# Patient Record
Sex: Female | Born: 1970 | Race: White | Hispanic: No | Smoking: Current every day smoker
Health system: Southern US, Community
[De-identification: ages and names within clinical notes are randomized; demographics above are authoritative.]

## PROBLEM LIST (undated history)

## (undated) DIAGNOSIS — D649 Anemia, unspecified: Secondary | ICD-10-CM

## (undated) DIAGNOSIS — D259 Leiomyoma of uterus, unspecified: Secondary | ICD-10-CM

---

## 2002-02-22 HISTORY — PX: DILATION AND CURETTAGE OF UTERUS: SHX78

## 2002-03-07 ENCOUNTER — Other Ambulatory Visit: Admission: RE | Admit: 2002-03-07 | Discharge: 2002-03-07 | Payer: Self-pay | Admitting: Obstetrics and Gynecology

## 2002-04-06 ENCOUNTER — Encounter (INDEPENDENT_AMBULATORY_CARE_PROVIDER_SITE_OTHER): Payer: Self-pay

## 2002-04-06 ENCOUNTER — Ambulatory Visit (HOSPITAL_COMMUNITY): Admission: RE | Admit: 2002-04-06 | Discharge: 2002-04-06 | Payer: Self-pay | Admitting: Obstetrics and Gynecology

## 2002-12-20 ENCOUNTER — Emergency Department (HOSPITAL_COMMUNITY): Admission: EM | Admit: 2002-12-20 | Discharge: 2002-12-20 | Payer: Self-pay | Admitting: Emergency Medicine

## 2009-12-12 ENCOUNTER — Emergency Department (HOSPITAL_COMMUNITY): Admission: EM | Admit: 2009-12-12 | Discharge: 2009-12-12 | Payer: Self-pay | Admitting: Emergency Medicine

## 2010-05-06 LAB — POCT URINALYSIS DIPSTICK
Glucose, UA: NEGATIVE mg/dL
Hgb urine dipstick: NEGATIVE
Nitrite: NEGATIVE
Protein, ur: NEGATIVE mg/dL
Specific Gravity, Urine: >=1.03 (ref 1.005–1.030)
Urobilinogen, UA: 0.2 mg/dL (ref 0.0–1.0)
pH: 5 (ref 5.0–8.0)

## 2010-05-06 LAB — POCT PREGNANCY, URINE: Preg Test, Ur: NEGATIVE

## 2010-07-02 ENCOUNTER — Emergency Department (HOSPITAL_COMMUNITY)
Admission: EM | Admit: 2010-07-02 | Discharge: 2010-07-02 | Disposition: A | Discharge status: Home / Self Care | Payer: Self-pay | Attending: Emergency Medicine | Admitting: Emergency Medicine

## 2010-07-02 ENCOUNTER — Emergency Department (HOSPITAL_COMMUNITY): Payer: Self-pay

## 2010-07-02 DIAGNOSIS — B356 Tinea cruris: Secondary | ICD-10-CM | POA: Insufficient documentation

## 2010-07-02 DIAGNOSIS — R109 Unspecified abdominal pain: Secondary | ICD-10-CM | POA: Insufficient documentation

## 2010-07-02 DIAGNOSIS — R300 Dysuria: Secondary | ICD-10-CM | POA: Insufficient documentation

## 2010-07-02 LAB — URINALYSIS, ROUTINE W REFLEX MICROSCOPIC
Bilirubin Urine: NEGATIVE
Glucose, UA: NEGATIVE mg/dL
Ketones, ur: NEGATIVE mg/dL
Nitrite: NEGATIVE
Protein, ur: NEGATIVE mg/dL
Specific Gravity, Urine: 1.01 (ref 1.005–1.030)
Urobilinogen, UA: 0.2 mg/dL (ref 0.0–1.0)
pH: 6 (ref 5.0–8.0)

## 2010-07-02 LAB — URINE MICROSCOPIC-ADD ON

## 2010-07-02 LAB — WET PREP, GENITAL
Trich, Wet Prep: NONE SEEN
Yeast Wet Prep HPF POC: NONE SEEN

## 2010-07-02 LAB — PREGNANCY, URINE: Preg Test, Ur: NEGATIVE

## 2010-07-03 LAB — GC/CHLAMYDIA PROBE AMP, GENITAL
Chlamydia, DNA Probe: NEGATIVE
GC Probe Amp, Genital: NEGATIVE

## 2010-07-10 NOTE — Op Note (Signed)
[name], [name]                              ACCOUNT NO.:  [id number]   MEDICAL RECORD NO.:  [id number]                   PATIENT TYPE:  AMB   LOCATION:  SDC                                  FACILITY:  [facility]   PHYSICIAN:  Miguel Aschoff, M.D.                    DATE OF BIRTH:  [birth year]   DATE OF PROCEDURE:  04/06/2002  DATE OF DISCHARGE:                                 OPERATIVE REPORT   PREOPERATIVE DIAGNOSIS:  Missed abortion.   POSTOPERATIVE DIAGNOSIS:  Missed abortion.   PROCEDURE:  Suction curettage.   SURGEON:  Miguel Aschoff, M.D.   ANESTHESIA:  IV sedation with paracervical block.   COMPLICATIONS:  None.   JUSTIFICATION:  The patient is a 40 year old white female gravida 2 para 1-0-  0-1 with last menstrual period of January 31, 2002.  She is now  approximately nine weeks by dates.  Serial ultrasound examinations have  revealed a significant lag in the growth of the gestational sac measuring  only six weeks with a fetal pole noted with no fetal heart activity.  The  dating is certain, and in view of these abnormal serial scans and no fetal  heart activity, she presents now to undergo suction curettage for missed  abortion.   DESCRIPTION OF PROCEDURE:  The patient was taken to the operating room,  placed in the supine position.  IV sedation was administered without  difficulty.  She was then placed in the dorsal lithotomy position, prepped  and draped in the usual sterile fashion.  Examination revealed the uterus to  be anterior, approximately seven to eight weeks in size.  No adnexal masses  are noted.  A speculum was placed in the vaginal vault.  The anterior  cervical lip was then injected with 6 mL of 1% Xylocaine; an additional 6 mL  were placed at the 4 o'clock and 8 o'clock positions.  After this was done,  the cervix was grasped with the tenaculum.  The endocervical canal was  dilated using 0 Pratt dilators and then a #8 vacuum trap was used to  evacuate the  contents of the uterus without difficulty.  A moderate amount  of POCs were obtained and sent for histologic study.  Sharp curettage  following suction curettage did not yield any significant additional tissue  and a final pass was made with the suction curette, and the procedure was  completed.  Hemostasis was readily achieved.  All instruments were removed.  Lap counts were taken and found to be correct.  At this point, the patient  was reversed from the anesthetic, taken to the recovery room in satisfactory  condition.  The patient is noted to be O positive blood type.   DISPOSITION:  1. The patient to be discharged home.  2. Medications for home include:     a. Doxycycline 100 mg twice a day  x3 days.     b. Darvocet-N 100 one q.4h. as needed for pain.  3.     She was instructed to place nothing in the vagina for two weeks and to call      for any problems such as fever, pain, or heavy bleeding.  4. The patient will be seen back in four weeks for follow-up examination.                                               Miguel Aschoff, M.D.    AR/MEDQ  D:  04/06/2002  T:  04/06/2002  Job:  [id number]

## 2014-05-02 ENCOUNTER — Encounter (HOSPITAL_COMMUNITY): Payer: Self-pay | Admitting: Emergency Medicine

## 2014-05-02 ENCOUNTER — Inpatient Hospital Stay (HOSPITAL_COMMUNITY)
Admission: EM | Admit: 2014-05-02 | Discharge: 2014-05-03 | DRG: 811 | Disposition: A | Discharge status: Home / Self Care | Payer: Medicaid Other | Attending: Internal Medicine | Admitting: Internal Medicine

## 2014-05-02 ENCOUNTER — Emergency Department (HOSPITAL_COMMUNITY): Payer: Medicaid Other

## 2014-05-02 DIAGNOSIS — R011 Cardiac murmur, unspecified: Secondary | ICD-10-CM | POA: Diagnosis present

## 2014-05-02 DIAGNOSIS — E876 Hypokalemia: Secondary | ICD-10-CM | POA: Diagnosis present

## 2014-05-02 DIAGNOSIS — Z6822 Body mass index (BMI) 22.0-22.9, adult: Secondary | ICD-10-CM

## 2014-05-02 DIAGNOSIS — Z720 Tobacco use: Secondary | ICD-10-CM

## 2014-05-02 DIAGNOSIS — N8320 Unspecified ovarian cysts: Secondary | ICD-10-CM

## 2014-05-02 DIAGNOSIS — E871 Hypo-osmolality and hyponatremia: Secondary | ICD-10-CM | POA: Diagnosis present

## 2014-05-02 DIAGNOSIS — D649 Anemia, unspecified: Principal | ICD-10-CM

## 2014-05-02 DIAGNOSIS — R109 Unspecified abdominal pain: Secondary | ICD-10-CM | POA: Insufficient documentation

## 2014-05-02 DIAGNOSIS — N926 Irregular menstruation, unspecified: Secondary | ICD-10-CM | POA: Diagnosis present

## 2014-05-02 DIAGNOSIS — D259 Leiomyoma of uterus, unspecified: Secondary | ICD-10-CM

## 2014-05-02 DIAGNOSIS — E43 Unspecified severe protein-calorie malnutrition: Secondary | ICD-10-CM | POA: Diagnosis present

## 2014-05-02 DIAGNOSIS — F17210 Nicotine dependence, cigarettes, uncomplicated: Secondary | ICD-10-CM | POA: Diagnosis present

## 2014-05-02 HISTORY — DX: Leiomyoma of uterus, unspecified: D25.9

## 2014-05-02 HISTORY — DX: Anemia, unspecified: D64.9

## 2014-05-02 LAB — I-STAT TROPONIN, ED: Troponin i, poc: 0.01 ng/mL (ref 0.00–0.08)

## 2014-05-02 LAB — CBC WITH DIFFERENTIAL/PLATELET
Basophils Absolute: 0.1 10*3/uL (ref 0.0–0.1)
Basophils Relative: 2 % — ABNORMAL HIGH (ref 0–1)
Eosinophils Absolute: 0 10*3/uL (ref 0.0–0.7)
Eosinophils Relative: 1 % (ref 0–5)
HCT: 16.8 % — ABNORMAL LOW (ref 36.0–46.0)
Hemoglobin: 4 g/dL — CL (ref 12.0–15.0)
Lymphocytes Relative: 28 % (ref 12–46)
Lymphs Abs: 1.3 10*3/uL (ref 0.7–4.0)
MCH: 13.9 pg — ABNORMAL LOW (ref 26.0–34.0)
MCHC: 23.8 g/dL — ABNORMAL LOW (ref 30.0–36.0)
MCV: 58.3 fL — ABNORMAL LOW (ref 78.0–100.0)
Monocytes Absolute: 0.3 10*3/uL (ref 0.1–1.0)
Monocytes Relative: 7 % (ref 3–12)
Neutro Abs: 2.8 10*3/uL (ref 1.7–7.7)
Neutrophils Relative %: 62 % (ref 43–77)
Platelets: 245 10*3/uL (ref 150–400)
RBC: 2.88 MIL/uL — ABNORMAL LOW (ref 3.87–5.11)
RDW: 19.7 % — ABNORMAL HIGH (ref 11.5–15.5)
WBC: 4.5 10*3/uL (ref 4.0–10.5)

## 2014-05-02 LAB — URINALYSIS, ROUTINE W REFLEX MICROSCOPIC
Bilirubin Urine: NEGATIVE
Glucose, UA: NEGATIVE mg/dL
Hgb urine dipstick: NEGATIVE
Ketones, ur: NEGATIVE mg/dL
Leukocytes, UA: NEGATIVE
Nitrite: NEGATIVE
Protein, ur: NEGATIVE mg/dL
Specific Gravity, Urine: 1.015 (ref 1.005–1.030)
Urobilinogen, UA: 0.2 mg/dL (ref 0.0–1.0)
pH: 5.5 (ref 5.0–8.0)

## 2014-05-02 LAB — COMPREHENSIVE METABOLIC PANEL
ALT: 13 U/L (ref 0–35)
AST: 13 U/L (ref 0–37)
Albumin: 4 g/dL (ref 3.5–5.2)
Alkaline Phosphatase: 48 U/L (ref 39–117)
Anion gap: 3 — ABNORMAL LOW (ref 5–15)
BUN: 10 mg/dL (ref 6–23)
CO2: 26 mmol/L (ref 19–32)
Calcium: 8.6 mg/dL (ref 8.4–10.5)
Chloride: 105 mmol/L (ref 96–112)
Creatinine, Ser: 0.46 mg/dL — ABNORMAL LOW (ref 0.50–1.10)
GFR calc Af Amer: >90 mL/min (ref >90)
GFR calc non Af Amer: >90 mL/min (ref >90)
Glucose, Bld: 89 mg/dL (ref 70–99)
Potassium: 3.8 mmol/L (ref 3.5–5.1)
Sodium: 134 mmol/L — ABNORMAL LOW (ref 135–145)
Total Bilirubin: 0.5 mg/dL (ref 0.3–1.2)
Total Protein: 7.1 g/dL (ref 6.0–8.3)

## 2014-05-02 LAB — IRON AND TIBC
Iron: <10 ug/dL — ABNORMAL LOW (ref 42–145)
UIBC: 477 ug/dL — ABNORMAL HIGH (ref 125–400)

## 2014-05-02 LAB — ABO/RH: ABO/RH(D): A POS

## 2014-05-02 LAB — VITAMIN B12: Vitamin B-12: 309 pg/mL (ref 211–911)

## 2014-05-02 LAB — POC OCCULT BLOOD, ED: Fecal Occult Bld: NEGATIVE

## 2014-05-02 LAB — PREPARE RBC (CROSSMATCH)

## 2014-05-02 LAB — RETICULOCYTES
RBC.: 2.71 MIL/uL — ABNORMAL LOW (ref 3.87–5.11)
Retic Count, Absolute: 32.5 10*3/uL (ref 19.0–186.0)
Retic Ct Pct: 1.2 % (ref 0.4–3.1)

## 2014-05-02 LAB — FOLATE: Folate: 14.9 ng/mL

## 2014-05-02 LAB — FERRITIN: Ferritin: 2 ng/mL — ABNORMAL LOW (ref 10–291)

## 2014-05-02 LAB — LIPASE, BLOOD: Lipase: 20 U/L (ref 11–59)

## 2014-05-02 LAB — POC URINE PREG, ED: PREG TEST UR: NEGATIVE

## 2014-05-02 MED ORDER — DIPHENHYDRAMINE HCL 25 MG PO CAPS: 25.0000 mg | ORAL_CAPSULE | Freq: Once | ORAL | Status: DC

## 2014-05-02 MED ORDER — IOHEXOL 300 MG/ML  SOLN
100.0000 mL | Freq: Once | INTRAMUSCULAR | Status: AC | PRN
  Administered 2014-05-02: 100 mL via INTRAVENOUS

## 2014-05-02 MED ORDER — HYDROMORPHONE HCL 1 MG/ML IJ SOLN
1.0000 mg | Freq: Once | INTRAMUSCULAR | Status: AC
  Administered 2014-05-02: 1 mg via INTRAVENOUS
  Filled 2014-05-02: qty 1

## 2014-05-02 MED ORDER — SODIUM CHLORIDE 0.9 % IV SOLN
Freq: Once | INTRAVENOUS | Status: AC
  Administered 2014-05-02: 250 mL via INTRAVENOUS

## 2014-05-02 MED ORDER — NICOTINE 21 MG/24HR TD PT24
21.0000 mg | MEDICATED_PATCH | Freq: Every day | TRANSDERMAL | Status: DC
  Administered 2014-05-03: 21 mg via TRANSDERMAL
  Filled 2014-05-02 (×2): qty 1

## 2014-05-02 MED ORDER — ACETAMINOPHEN 650 MG RE SUPP: 650.0000 mg | Freq: Four times a day (QID) | RECTAL | Status: DC | PRN

## 2014-05-02 MED ORDER — IOHEXOL 300 MG/ML  SOLN
50.0000 mL | Freq: Once | INTRAMUSCULAR | Status: AC | PRN
  Administered 2014-05-02: 50 mL via ORAL

## 2014-05-02 MED ORDER — ONDANSETRON HCL 4 MG/2ML IJ SOLN
4.0000 mg | Freq: Four times a day (QID) | INTRAMUSCULAR | Status: DC | PRN
Start: 2014-05-02 — End: 2014-05-03
  Administered 2014-05-02: 4 mg via INTRAVENOUS
  Filled 2014-05-02: qty 2

## 2014-05-02 MED ORDER — ACETAMINOPHEN 325 MG PO TABS: 650.0000 mg | ORAL_TABLET | Freq: Once | ORAL | Status: DC

## 2014-05-02 MED ORDER — SODIUM CHLORIDE 0.9 % IV SOLN
INTRAVENOUS | Status: DC
  Administered 2014-05-02 – 2014-05-03 (×2): 1000 mL via INTRAVENOUS

## 2014-05-02 MED ORDER — ONDANSETRON HCL 4 MG/2ML IJ SOLN
4.0000 mg | Freq: Once | INTRAMUSCULAR | Status: AC
  Administered 2014-05-02: 4 mg via INTRAVENOUS
  Filled 2014-05-02: qty 2

## 2014-05-02 MED ORDER — ACETAMINOPHEN 325 MG PO TABS: 650.0000 mg | ORAL_TABLET | Freq: Four times a day (QID) | ORAL | Status: DC | PRN

## 2014-05-02 MED ORDER — OXYCODONE-ACETAMINOPHEN 5-325 MG PO TABS
2.0000 | ORAL_TABLET | Freq: Once | ORAL | Status: AC
  Administered 2014-05-03: 1 via ORAL
  Filled 2014-05-02: qty 2

## 2014-05-02 MED ORDER — SODIUM CHLORIDE 0.9 % IV SOLN
Freq: Once | INTRAVENOUS | Status: AC
  Administered 2014-05-02: 19:00:00 via INTRAVENOUS

## 2014-05-02 MED ORDER — FUROSEMIDE 10 MG/ML IJ SOLN
20.0000 mg | Freq: Once | INTRAMUSCULAR | Status: AC
  Administered 2014-05-03: 20 mg via INTRAVENOUS
  Filled 2014-05-02 (×2): qty 2

## 2014-05-02 NOTE — ED Provider Notes (Signed)
CSN: [id number]     Arrival date & time 05/02/14  1258 History   First MD Initiated Contact with Patient 05/02/14 1501     Chief Complaint  Patient presents with  . Abdominal Pain     (Consider location/radiation/quality/duration/timing/severity/associated sxs/prior Treatment) Patient is a 44 y.o. female presenting with abdominal pain. The history is provided by the patient. No language interpreter was used.  Abdominal Pain Pain location:  LLQ and RLQ Pain quality: heavy   Pain radiates to:  L flank Pain severity:  Moderate Onset quality:  Gradual Timing:  Intermittent Progression:  Worsening Chronicity:  New Associated symptoms: fatigue, nausea and shortness of breath   Associated symptoms: no diarrhea, no vaginal bleeding, no vaginal discharge and no vomiting   Nausea:    Severity:  Moderate   Onset quality:  Gradual   Timing:  Intermittent (with eating)   Past Medical History  Diagnosis Date  . Fibroid, uterine   . Anemia    History reviewed. No pertinent past surgical history. History reviewed. No pertinent family history. History  Substance Use Topics  . Smoking status: Current Every Day Smoker  . Smokeless tobacco: Not on file  . Alcohol Use: No   OB History    No data available     Review of Systems  Constitutional: Positive for fatigue.  Respiratory: Positive for shortness of breath.   Gastrointestinal: Positive for nausea and abdominal pain. Negative for vomiting, diarrhea, blood in stool, anal bleeding and rectal pain.  Genitourinary: Positive for flank pain. Negative for vaginal bleeding and vaginal discharge.  All other systems reviewed and are negative.     Allergies  Review of patient's allergies indicates no known allergies.  Home Medications   Prior to Admission medications   Medication Sig Start Date End Date Taking? Authorizing Provider  Aspirin-Salicylamide-Caffeine (BC HEADACHE POWDER PO) Take 1 packet by mouth as needed (headache).    Yes Historical Provider, MD  ibuprofen (ADVIL,MOTRIN) 200 MG tablet Take 800 mg by mouth every 6 (six) hours as needed for headache or moderate pain.   Yes Historical Provider, MD   BP 134/56 mmHg  Pulse 85  Temp(Src) 98.9 F (37.2 C) (Oral)  Resp 14  SpO2 94% Physical Exam  Constitutional: She is oriented to person, place, and time. She appears well-developed and well-nourished.  HENT:  Head: Normocephalic and atraumatic.  Eyes: Pupils are equal, round, and reactive to light.  Neck: Neck supple.  Cardiovascular: Normal rate and regular rhythm.   Pulmonary/Chest: Effort normal and breath sounds normal.  Abdominal: Soft. Bowel sounds are normal. She exhibits mass. There is tenderness.  Musculoskeletal: She exhibits no edema or tenderness.  Lymphadenopathy:    She has no cervical adenopathy.  Neurological: She is alert and oriented to person, place, and time.  Skin: Skin is warm and dry.  Psychiatric: She has a normal mood and affect.  Nursing note and vitals reviewed.   ED Course  Procedures (including critical care time) Labs Review Labs Reviewed  CBC WITH DIFFERENTIAL/PLATELET - Abnormal; Notable for the following:    RBC 2.88 (*)    Hemoglobin 4.0 (*)    HCT 16.8 (*)    MCV 58.3 (*)    MCH 13.9 (*)    MCHC 23.8 (*)    RDW 19.7 (*)    All other components within normal limits  COMPREHENSIVE METABOLIC PANEL - Abnormal; Notable for the following:    Sodium 134 (*)    Creatinine, Ser 0.46 (*)  Anion gap 3 (*)    All other components within normal limits  LIPASE, BLOOD  URINALYSIS, ROUTINE W REFLEX MICROSCOPIC  VITAMIN B12  FOLATE  IRON AND TIBC  FERRITIN  RETICULOCYTES  POC OCCULT BLOOD, ED  TYPE AND SCREEN    Imaging Review No results found.   EKG Interpretation None     44 year old female reporting unintentional weight loss of about 60 pounds in last eight months.  Intermittent bilateral lower abdominal pain, history of uterine and ovarian fibroids.   Anemia as a child, and with pregnancy.  Blood work today reveals significant anemia.  Patient denies vaginal bleeding, dark or tarry looking stools.  Intermittent exertional dyspnea.  Has not been worked up for anemia.  Small palpable mass in outer aspect of LLQ, with pain radiating to left flank, along with CVA tenderness.   Labs reveal microcytic anemia.  Hemoccult negative.  No UTI.  CT without abdominal wall mass or other acute findings.  Mildly enlarged spleen. Left ovarian cyst, right uterine fibroid.  Patient admitted for blood transfusion and further evaluation of anemia without a definitive cause. MDM   Final diagnoses:  None    Abdominal pain. Anemia.    Etta Quill, NP 05/02/14 1913  Debby Freiberg, MD 05/06/14 [phone]

## 2014-05-02 NOTE — Progress Notes (Signed)
EDCM spoke to patient at bedside.  Patient confirms she does not have a pcp or insurnace living in [name]  [facility] provided patient with pamphlet to [name] explained services and walk in times.  EDCM provided patient with phone number and address to the [facility] [place] [facility] [employer] for [employer], and the Department of health and human services in [place].  Patient reports she went to the Department of health in [place] and they refused her care because, "my husband made too much money."  Kendra [name] provided patient with list of discount pharmacies and websites [email or web address],org and [email or web address] for medication assistance.  aslo provided patient with phone number to inquire about  Affordable Care Act.  Patient thankful for resources.  No further EDCM needs at this time.

## 2014-05-02 NOTE — ED Notes (Signed)
Pt reports 60lb weight loss over past 8 months. Pt gets nauseaed with eating, no diarrhea or emesis. Pt also notes a knot on L side of abd and L posterior. Hx of fibroids, anemia and decreased WBCs 2 years ago.

## 2014-05-02 NOTE — H&P (Signed)
[facility] History and Physical  Kendra [name] NID:[id number] DOB: [birth year] DOA: 05/02/2014  Referring physician: Mr. Etta Quill, NP PCP: No primary care provider on file.  Specialists:   Chief Complaint: Abdominal pain  HPI: Kendra [name] is a 44 y.o. female  History of uterine fibroids, tobacco abuse, anemia, that presented to the emergency department with complaints of abdominal pain. Patient states her abdominal pain has been ongoing for approximately one year. Patient was asked why she came in today, she states that it was due to her family. Patient has had abnormal periods for quite some time and has had uterine fibroid in the past. She does not follow-up with a gynecologist. Patient does admit to increased fatigue as well as shortness of breath with exertion. Patient also states she has had some weight loss over the past several months, approximately 60 pounds. Patient does admit to smoking approximate one pack per day. Patient denies any recent travel, ill contacts, chest pain.  In the ER, hemoglobin was on before. CT scan of the abdomen showed no acute process however did note a 5.3 similar left ovarian cyst. Occult blood was negative. [facility] called for admission.  Review of Systems:  Constitutional: Complains of fatigue and weight loss.  HEENT: Denies photophobia, eye pain, redness, hearing loss, ear pain, congestion, sore throat, rhinorrhea, sneezing, mouth sores, trouble swallowing, neck pain, neck stiffness and tinnitus.   Respiratory: Complains of shortness of breath with exertion.   Cardiovascular: Denies chest pain, palpitations and leg swelling.  Gastrointestinal: Denies nausea, vomiting, abdominal pain, diarrhea, constipation, blood in stool and abdominal distention.  Genitourinary: Denies urinary symptoms. Does complain of having heavy and abnormal menstrual cycle. Musculoskeletal: Denies myalgias, back pain, joint swelling, arthralgias and gait problem.  Skin: Denies  pallor, rash and wound.  Neurological: Denies dizziness, seizures, syncope, weakness, light-headedness, numbness and headaches.  Hematological: Denies adenopathy. Easy bruising, personal or family bleeding history  Psychiatric/Behavioral: Denies suicidal ideation, mood changes, confusion, nervousness, sleep disturbance and agitation  Past Medical History  Diagnosis Date  . Fibroid, uterine   . Anemia    History reviewed. No pertinent past surgical history. Social History:  reports that she has been smoking.  She does not have any smokeless tobacco history on file. She reports that she does not drink alcohol or use illicit drugs.  No Known Allergies  Family history -Father died of coronary artery disease in his 8s. -Mother has history of hypertension  Prior to Admission medications   Medication Sig Start Date End Date Taking? Authorizing Provider  Aspirin-Salicylamide-Caffeine (BC HEADACHE POWDER PO) Take 1 packet by mouth as needed (headache).   Yes Historical Provider, MD  ibuprofen (ADVIL,MOTRIN) 200 MG tablet Take 800 mg by mouth every 6 (six) hours as needed for headache or moderate pain.   Yes Historical Provider, MD   Physical Exam: Filed Vitals:   05/02/14 1730  BP: 109/61  Pulse: 72  Temp:   Resp: 14     General: Well developed, well nourished, NAD, appears stated age  HEENT: NCAT, PERRLA, EOMI, Anicteic Sclera, mucous membranes moist.   Neck: Supple, no JVD, no masses  Cardiovascular: S1 S2 auscultated, 3/6 SEM. Regular rate and rhythm.  Respiratory: Clear to auscultation bilaterally with equal chest rise  Abdomen: Soft, nontender, nondistended, + bowel sounds  Extremities: warm dry without cyanosis clubbing or edema  Neuro: AAOx3, cranial nerves grossly intact. Strength 5/5 in patient's upper and lower extremities bilaterally  Skin: Without rashes exudates or nodules  Psych: Normal affect and demeanor with intact judgement and insight  Labs on  Admission:  Basic Metabolic Panel:  Recent Labs Lab 05/02/14 1344  NA 134*  K 3.8  CL 105  CO2 26  GLUCOSE 89  BUN 10  CREATININE 0.46*  CALCIUM 8.6   Liver Function Tests:  Recent Labs Lab 05/02/14 1344  AST 13  ALT 13  ALKPHOS 48  BILITOT 0.5  PROT 7.1  ALBUMIN 4.0    Recent Labs Lab 05/02/14 1344  LIPASE 20   No results for input(s): AMMONIA in the last 168 hours. CBC:  Recent Labs Lab 05/02/14 1344  WBC 4.5  NEUTROABS 2.8  HGB 4.0*  HCT 16.8*  MCV 58.3*  PLT 245   Cardiac Enzymes: No results for input(s): CKTOTAL, CKMB, CKMBINDEX, TROPONINI in the last 168 hours.  BNP (last 3 results) No results for input(s): BNP in the last 8760 hours.  ProBNP (last 3 results) No results for input(s): PROBNP in the last 8760 hours.  CBG: No results for input(s): GLUCAP in the last 168 hours.  Radiological Exams on Admission: Ct Abdomen Pelvis W Contrast  05/02/2014   CLINICAL DATA:  60 lb weight loss over the last 8 months. Nausea with eating. Palpable knot on the posterior left-sided the abdomen. History of fibroids, anemia and leukopenia. Initial encounter.  EXAM: CT ABDOMEN AND PELVIS WITH CONTRAST  TECHNIQUE: Multidetector CT imaging of the abdomen and pelvis was performed using the standard protocol following bolus administration of intravenous contrast.  CONTRAST:  119mL OMNIPAQUE IOHEXOL 300 MG/ML  SOLN  COMPARISON:  Pelvic ultrasound 07/02/2010.  FINDINGS: Lower chest: Clear lung bases. No significant pleural or pericardial effusion.  Hepatobiliary: The liver is normal in density without focal abnormality. No evidence of gallstones, gallbladder wall thickening or biliary dilatation.  Pancreas: Unremarkable. No pancreatic ductal dilatation or surrounding inflammatory changes.  Spleen: The spleen is enlarged, measuring 14.6 x 9.1 x 8.5 cm for an estimated volume of 565 ml. No focal splenic abnormality demonstrated.  Adrenals/Urinary Tract: Both adrenal glands  appear normal.The kidneys appear normal without evidence of urinary tract calculus, suspicious lesion or hydronephrosis. No bladder abnormalities are seen.  Stomach/Bowel: No evidence of bowel wall thickening, distention or surrounding inflammatory change.  Vascular/Lymphatic: There are no enlarged abdominal or pelvic lymph nodes. No significant vascular findings are present.  Reproductive: 2.9 cm calcified right uterine fibroid noted. Water density left adnexal lesion measuring 5.3 x 4.3 cm is most consistent with a functional cyst of the left ovary. The right ovary appears normal. There is no suspicious adnexal finding or pelvic inflammatory process.  Other: No evidence of abdominal wall mass or hernia.  Musculoskeletal: No acute or significant osseous findings.  IMPRESSION: 1. No abdominal wall mass or acute findings demonstrated within the abdomen or pelvis. 2. Mild nonspecific splenomegaly. This may contribute to the patient's anemia and leukopenia. Further hematologic assessment may be warranted if not previously performed. 3. 5.3 cm left ovarian cyst, likely functional. Because of size greater than 5 cm, current guidelines call for ultrasound follow up in 1 year to further evaluate. This recommendation follows ACR consensus guidelines: White Paper of the ACR Incidental Findings Committee II on Adnexal Findings. J Am Coll Radiol [phone].   Electronically Signed   By: Richardean Sale M.D.   On: 05/02/2014 17:18    EKG: Independently reviewed. Sinus rhythm, rate 81  Assessment/Plan  Symptomatic Anemia -Patient will be admitted for observation -ER has ordered 2 uPRBCs to be  transfused -Will order another 2uPRBCs (for a total of 4) -Hemoglobin on admission 4 -Patient has a history of irregular menses and uterine fibroid -She has had fatigue, shortness of breath, has been ongoing for approximately one year -Occult negative -Denies vaginal bleeding at this time  Abdominal pain with history  of uterine fibroid -CT of the abdomen shows no abdominal wall mass or acute findings, mild nonspecific splenomegaly, 5.3 cm left ovarian cyst likely functional- recommend repeat ultrasound in one year to reevaluate -Patient states her abdominal pain has improved -Will provide pain control -Possibly related to uterine fibroid or cyst -Will speak with gynecology regarding outpatient follow-up  Tobacco abuse -Patient counseled against smoking -Nicotine patch ordered  Mild hyponatremia -Will give gentle IVF  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 Admission, patients condition and plan of care including tests being ordered have been discussed with the patient and  family who indicate understanding and agree with the plan and Code Status.  Disposition Plan: Admitted for observation and blood transfusion  Time spent: 60 minutes  Briceson Broadwater D.O. [facility] Pager [phone]  If 7PM-7AM, please contact night-coverage [email or web address] Password [facility] 05/02/2014, 6:23 PM

## 2014-05-02 NOTE — ED Notes (Signed)
Patient transported to CT 

## 2014-05-02 NOTE — ED Notes (Signed)
Pt called in lobby no response 

## 2014-05-02 NOTE — ED Notes (Signed)
Attempted to call report to floor.Nurse states she is not quite ready at this time. Number given to call back for report.

## 2014-05-02 NOTE — ED Notes (Signed)
Blood consent signed, pt educated on the process of blood transfusion, pending lab call for blood type and screen completion and blood pick up.

## 2014-05-03 DIAGNOSIS — D649 Anemia, unspecified: Secondary | ICD-10-CM | POA: Diagnosis present

## 2014-05-03 DIAGNOSIS — Z6822 Body mass index (BMI) 22.0-22.9, adult: Secondary | ICD-10-CM | POA: Diagnosis not present

## 2014-05-03 DIAGNOSIS — D259 Leiomyoma of uterus, unspecified: Secondary | ICD-10-CM | POA: Diagnosis present

## 2014-05-03 DIAGNOSIS — N926 Irregular menstruation, unspecified: Secondary | ICD-10-CM | POA: Diagnosis present

## 2014-05-03 DIAGNOSIS — E43 Unspecified severe protein-calorie malnutrition: Secondary | ICD-10-CM

## 2014-05-03 DIAGNOSIS — R109 Unspecified abdominal pain: Secondary | ICD-10-CM | POA: Diagnosis present

## 2014-05-03 DIAGNOSIS — E871 Hypo-osmolality and hyponatremia: Secondary | ICD-10-CM | POA: Diagnosis present

## 2014-05-03 DIAGNOSIS — R011 Cardiac murmur, unspecified: Secondary | ICD-10-CM | POA: Diagnosis present

## 2014-05-03 DIAGNOSIS — N8320 Unspecified ovarian cysts: Secondary | ICD-10-CM | POA: Diagnosis present

## 2014-05-03 DIAGNOSIS — F17210 Nicotine dependence, cigarettes, uncomplicated: Secondary | ICD-10-CM | POA: Diagnosis present

## 2014-05-03 DIAGNOSIS — E876 Hypokalemia: Secondary | ICD-10-CM | POA: Diagnosis present

## 2014-05-03 LAB — RETICULOCYTES
RBC.: 4.01 MIL/uL (ref 3.87–5.11)
RETIC COUNT ABSOLUTE: 16 10*3/uL — AB (ref 19.0–186.0)
Retic Ct Pct: 0.4 % (ref 0.4–3.1)

## 2014-05-03 LAB — CBC
HCT: 26.1 % — ABNORMAL LOW (ref 36.0–46.0)
HEMOGLOBIN: 7.7 g/dL — AB (ref 12.0–15.0)
MCH: 19.8 pg — ABNORMAL LOW (ref 26.0–34.0)
MCHC: 29.5 g/dL — ABNORMAL LOW (ref 30.0–36.0)
MCV: 67.3 fL — ABNORMAL LOW (ref 78.0–100.0)
Platelets: 228 10*3/uL (ref 150–400)
RBC: 3.88 MIL/uL (ref 3.87–5.11)
RDW: 25.5 % — ABNORMAL HIGH (ref 11.5–15.5)
WBC: 5.5 10*3/uL (ref 4.0–10.5)

## 2014-05-03 LAB — BASIC METABOLIC PANEL
Anion gap: 5 (ref 5–15)
BUN: 9 mg/dL (ref 6–23)
CHLORIDE: 103 mmol/L (ref 96–112)
CO2: 26 mmol/L (ref 19–32)
Calcium: 8.5 mg/dL (ref 8.4–10.5)
Creatinine, Ser: 0.52 mg/dL (ref 0.50–1.10)
GFR calc Af Amer: >90 mL/min (ref >90)
GFR calc non Af Amer: >90 mL/min (ref >90)
GLUCOSE: 115 mg/dL — AB (ref 70–99)
POTASSIUM: 3.3 mmol/L — AB (ref 3.5–5.1)
SODIUM: 134 mmol/L — AB (ref 135–145)

## 2014-05-03 LAB — IRON AND TIBC
IRON: 127 ug/dL (ref 42–145)
SATURATION RATIOS: 27 % (ref 20–55)
TIBC: 473 ug/dL — ABNORMAL HIGH (ref 250–470)
UIBC: 346 ug/dL (ref 125–400)

## 2014-05-03 LAB — FOLATE: Folate: 12.1 ng/mL

## 2014-05-03 LAB — PREPARE RBC (CROSSMATCH)

## 2014-05-03 LAB — VITAMIN B12: VITAMIN B 12: 255 pg/mL (ref 211–911)

## 2014-05-03 LAB — FERRITIN: Ferritin: 4 ng/mL — ABNORMAL LOW (ref 10–291)

## 2014-05-03 MED ORDER — ENSURE COMPLETE PO LIQD
237.0000 mL | Freq: Two times a day (BID) | ORAL | Status: DC
  Administered 2014-05-03: 237 mL via ORAL

## 2014-05-03 MED ORDER — OXYCODONE HCL 5 MG PO TABS
5.0000 mg | ORAL_TABLET | Freq: Four times a day (QID) | ORAL | Status: DC | PRN
  Administered 2014-05-03: 5 mg via ORAL
  Filled 2014-05-03: qty 1

## 2014-05-03 MED ORDER — MEDROXYPROGESTERONE ACETATE 10 MG PO TABS: 10.0000 mg | ORAL_TABLET | Freq: Every day | ORAL | Status: DC

## 2014-05-03 MED ORDER — NICOTINE 21 MG/24HR TD PT24: 21.0000 mg | MEDICATED_PATCH | Freq: Every day | TRANSDERMAL | Status: DC

## 2014-05-03 MED ORDER — ENSURE COMPLETE PO LIQD: 237.0000 mL | Freq: Two times a day (BID) | ORAL | Status: DC

## 2014-05-03 MED ORDER — POTASSIUM CHLORIDE CRYS ER 20 MEQ PO TBCR
40.0000 meq | EXTENDED_RELEASE_TABLET | Freq: Once | ORAL | Status: AC
  Administered 2014-05-03: 40 meq via ORAL
  Filled 2014-05-03: qty 2

## 2014-05-03 NOTE — Discharge Summary (Signed)
Physician Discharge Summary  FRANCA STAKES OFH:[id number] DOB: 1970-08-27 DOA: 05/02/2014  PCP: No primary care provider on file.  Admit date: 05/02/2014 Discharge date: 05/03/2014  Time spent: 35 minutes  Recommendations for Outpatient Follow-up:  Patient will be discharged to home.  She will need to follow up with [facility] at the specified time.  She will need to be referred to gynecology at that time.  Patient should continue her medications as prescribed. Patient should also refrain from tobacco abuse. Patient may resume a regular diet. Patient resume activities tolerated.  Discharge Diagnoses:  Symptomatic Anemia Abdominal pain with history of uterine  Tobacco abuse Mild hyponatremia Hypokalemia Systolic murmur  Discharge Condition: Stable  Diet recommendation: Regular  Filed Weights   05/02/14 2110  Weight: 71.8 kg (158 lb 4.6 oz)    History of present illness:  On 05/02/2014 Kendra [name] is a 44 y.o. female with a history of uterine fibroids, tobacco abuse, anemia, that presented to the emergency department with complaints of abdominal pain. Patient states her abdominal pain has been ongoing for approximately one year. Patient was asked why she came in today, she states that it was due to her family. Patient has had abnormal periods for quite some time and has had uterine fibroid in the past. She does not follow-up with a gynecologist. Patient does admit to increased fatigue as well as shortness of breath with exertion. Patient also states she has had some weight loss over the past several months, approximately 60 pounds. Patient does admit to smoking approximate one pack per day. Patient denies any recent travel, ill contacts, chest pain. In the ER, hemoglobin was on before. CT scan of the abdomen showed no acute process however did note a 5.3 similar left ovarian cyst. Occult blood was negative. [facility] called for admission.  Hospital Course:  Symptomatic  Anemia -Up on admission, hemoglobin was 4  -Patient received 4uPRBC -Hemoglobin 7.7 -Patient has a history of irregular menses and uterine fibroid -She has had fatigue, shortness of breath, has been ongoing for approximately one year -Occult negative -Denies vaginal bleeding at this time  Abdominal pain with history of uterine fibroid -CT of the abdomen shows no abdominal wall mass or acute findings, mild nonspecific splenomegaly, 2.9cm calcified right uterine fibroid, 5.3 cm left ovarian cyst likely functional- recommend repeat ultrasound in one year to reevaluate -Patient states her abdominal pain has improved -Continue pain control -Possibly related to uterine fibroid or cyst -Spoke with Dr. 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 Nehemiah Settle, via phone  Discharge Exam: Filed Vitals:   05/03/14 1404  BP: 111/60  Pulse: 70  Temp: 98.3 F (36.8 C)  Resp: 18    General: Well developed, thin  HEENT: NCAT, mucous membranes moist.   Cardiovascular: S1 S2 auscultated, 3/6 SEM. Regular rate and rhythm.  Respiratory: Clear to auscultation  Abdomen: Soft, nontender, nondistended, + bowel sounds  Extremities: warm dry without cyanosis clubbing or edema  Neuro: AAOx3, nonfocal  Psych: Appropriate mood and affect  Discharge Instructions  Medication List    STOP taking these medications        BC HEADACHE POWDER PO     ibuprofen 200 MG tablet   Commonly known as:  ADVIL,MOTRIN      TAKE these medications        feeding supplement (ENSURE COMPLETE) Liqd  Take 237 mLs by mouth 2 (two) times daily between meals.     medroxyPROGESTERone 10 MG tablet  Commonly known as:  PROVERA  Take 1 tablet (10 mg total) by mouth daily.     nicotine 21 mg/24hr patch  Commonly known as:  NICODERM CQ - dosed in mg/24 hours  Place 1 patch (21 mg total) onto the skin daily.       No Known Allergies Follow-up Information    Follow up with [facility]    . Go on 05/07/2014.   Why:  9:15am For hospital follow up appointment. Bring photo ID/all home medications/$20 copay   Contact information:   [address] [place]  [id number] [phone]       The results of significant diagnostics from this hospitalization (including imaging, microbiology, ancillary and laboratory) are listed below for reference.    Significant Diagnostic Studies: Ct Abdomen Pelvis W Contrast  05/02/2014   CLINICAL DATA:  60 lb weight loss over the last 8 months. Nausea with eating. Palpable knot on the posterior left-sided the abdomen. History of fibroids, anemia and leukopenia. Initial encounter.  EXAM: CT ABDOMEN AND PELVIS WITH CONTRAST  TECHNIQUE: Multidetector CT imaging of the abdomen and pelvis was performed using the standard protocol following bolus administration of intravenous contrast.  CONTRAST:  157mL OMNIPAQUE IOHEXOL 300 MG/ML  SOLN  COMPARISON:  Pelvic ultrasound 07/02/2010.  FINDINGS: Lower chest: Clear lung bases. No significant pleural or pericardial effusion.  Hepatobiliary: The liver is normal in density without focal abnormality. No evidence of gallstones, gallbladder wall thickening or biliary dilatation.  Pancreas: Unremarkable. No pancreatic ductal dilatation or surrounding inflammatory changes.  Spleen: The spleen is enlarged, measuring 14.6 x 9.1 x 8.5 cm for an estimated volume of 565 ml. No  focal splenic abnormality demonstrated.  Adrenals/Urinary Tract: Both adrenal glands appear normal.The kidneys appear normal without evidence of urinary tract calculus, suspicious lesion or hydronephrosis. No bladder abnormalities are seen.  Stomach/Bowel: No evidence of bowel wall thickening, distention or surrounding inflammatory change.  Vascular/Lymphatic: There are no enlarged abdominal or pelvic lymph nodes. No significant vascular findings are present.  Reproductive: 2.9 cm calcified right uterine fibroid noted. Water density left adnexal lesion measuring 5.3 x 4.3 cm is most consistent with a functional cyst of the left ovary. The right ovary appears normal. There is no suspicious adnexal finding or pelvic inflammatory process.  Other: No evidence of abdominal wall mass or hernia.  Musculoskeletal: No acute or significant osseous findings.  IMPRESSION: 1. No abdominal wall mass or acute findings demonstrated within the abdomen or pelvis. 2. Mild nonspecific splenomegaly. This may contribute to the patient's anemia and leukopenia. Further hematologic assessment may be warranted if not previously performed. 3. 5.3 cm left ovarian cyst, likely functional. Because of size greater than 5 cm, current guidelines call for ultrasound follow up in 1 year to further evaluate. This recommendation follows ACR consensus guidelines: White Paper of the ACR Incidental Findings Committee II on Adnexal Findings. J Am Coll Radiol [phone].   Electronically Signed   By: Richardean Sale M.D.   On: 05/02/2014 17:18    Microbiology:  No results found for this or any previous visit (from the past 240 hour(s)).   Labs: Basic Metabolic Panel:  Recent Labs Lab 05/02/14 1344 05/03/14 1122  NA 134* 134*  K 3.8 3.3*  CL 105 103  CO2 26 26  GLUCOSE 89 115*  BUN 10 9  CREATININE 0.46* 0.52  CALCIUM 8.6 8.5   Liver Function Tests:  Recent Labs Lab 05/02/14 1344  AST 13  ALT 13  ALKPHOS 48  BILITOT 0.5   PROT 7.1  ALBUMIN 4.0    Recent Labs Lab 05/02/14 1344  LIPASE 20   No results for input(s): AMMONIA in the last 168 hours. CBC:  Recent Labs Lab 05/02/14 1344 05/03/14 1122  WBC 4.5 5.5  NEUTROABS 2.8  --   HGB 4.0* 7.7*  HCT 16.8* 26.1*  MCV 58.3* 67.3*  PLT 245 228   Cardiac Enzymes: No results for input(s): CKTOTAL, CKMB, CKMBINDEX, TROPONINI in the last 168 hours. BNP: BNP (last 3 results) No results for input(s): BNP in the last 8760 hours.  ProBNP (last 3 results) No results for input(s): PROBNP in the last 8760 hours.  CBG: No results for input(s): GLUCAP in the last 168 hours.     SignedCALY, PELLUM  [facility] 05/03/2014, 2:11 PM

## 2014-05-03 NOTE — Progress Notes (Signed)
Patient d/c instructions given to patient,verbalized understanding,stable to go home. No bleeding noted.

## 2014-05-03 NOTE — Plan of Care (Signed)
Problem: Phase I Progression Outcomes Goal: Pain controlled with appropriate interventions Outcome: Progressing Pain in lower back and abdomen. Order obtained for percocet x1 with decrease in pain resulting. Used heat packs as well.

## 2014-05-03 NOTE — Care Management Note (Signed)
CARE MANAGEMENT NOTE 05/03/2014  Patient:  Kendra [name], Kendra [name]   Account Number:  [id number]  Date Initiated:  05/03/2014  Documentation initiated by:  Marney Doctor  Subjective/Objective Assessment:   44 yo admitted with Anemia     Action/Plan:   From home with husband   Anticipated DC Date:  05/03/2014   Anticipated DC Plan:  [place]  PCP issues  CM consult      Choice offered to / List presented to:             Status of service:  Completed, signed off Medicare Important Message given?   (If response is "NO", the following Medicare IM given date fields will be blank) Date Medicare IM given:   Medicare IM given by:   Date Additional Medicare IM given:   Additional Medicare IM given by:    Discharge Disposition:    Per UR Regulation:  Reviewed for med. necessity/level of care/duration of stay  If discussed at [place] of Stay Meetings, dates discussed:    Comments:  05/03/14 Marney Doctor RN,BSN,NCM [phone] Please see EDCM note.  This CM made pt a follow up appointment at the [facility] and placed on the AVS.  Pt instructed to take any new prescriptions from the hospital to the [facility] pharmacy for help with her medications.  Pt states she understands.

## 2014-05-03 NOTE — Progress Notes (Signed)
INITIAL NUTRITION ASSESSMENT  DOCUMENTATION CODES Per approved criteria  Severe malnutrition in the context of acute illness  Pt meets criteria for severe MALNUTRITION in the context of acute illness as evidenced by 19% weight loss x 8-9 months and energy intake <50% for >/= 5 days.  INTERVENTION: Provide Ensure Complete po BID, each supplement provides 350 kcal and 13 grams of protein Encourage PO intake  NUTRITION DIAGNOSIS: Unintentional weight loss related to poor appetite as evidenced by 37 lb weight loss over 8-9 months.   Goal: Pt to meet >/= 90% of their estimated nutrition needs   Monitor:  PO and supplemental intake, weight, labs, I/O's  Reason for Assessment: Pt identified as at nutrition risk on the Malnutrition Screen Tool  Admitting Dx: Abdominal pain  ASSESSMENT: 44 y.o. female  History of uterine fibroids, tobacco abuse, anemia, that presented to the emergency department with complaints of abdominal pain. Patient also states she has had some weight loss over the past several months, approximately 60 pounds.  Pt reports 60 lb weight loss over the last 8-9 months. Per pt's stated UBW, pt has lost 37 lb over the last 8-9 months (19% weight loss x 8-9 months, significant for time frame). Pt states that her appetite has diminished this past year and most recently over the past month she has been feeling very nauseous. Pt states she has only been eating 1 meal a day. Pt's husband states pt would snack on chips throughout the day and only eat dinner.   Pt is willing to try Ensure supplements. RD to order.  Nutrition focused physical exam shows no sign of depletion of muscle mass or body fat.  Labs reviewed; Low Na Low Creatinine  Height: Ht Readings from Last 1 Encounters:  05/02/14 5\' 11"  (1.803 m)    Weight: Wt Readings from Last 1 Encounters:  05/02/14 158 lb 4.6 oz (71.8 kg)    Ideal Body Weight: 155 lb  % Ideal Body Weight: 102%  Wt Readings from  Last 10 Encounters:  05/02/14 158 lb 4.6 oz (71.8 kg)    Usual Body Weight: 195-200 lb  % Usual Body Weight: 81%  BMI:  Body mass index is 22.09 kg/(m^2).  Estimated Nutritional Needs: Kcal: 1750-1950 Protein: 85-95g Fluid: 1.8L/day  Skin: intact  Diet Order: Diet regular  EDUCATION NEEDS: -No education needs identified at this time   Intake/Output Summary (Last 24 hours) at 05/03/14 0924 Last data filed at 05/03/14 0814  Gross per 24 hour  Intake   2880 ml  Output    100 ml  Net   2780 ml    Last BM: 3/10  Labs:   Recent Labs Lab 05/02/14 1344  NA 134*  K 3.8  CL 105  CO2 26  BUN 10  CREATININE 0.46*  CALCIUM 8.6  GLUCOSE 89    CBG (last 3)  No results for input(s): GLUCAP in the last 72 hours.  Scheduled Meds: . acetaminophen  650 mg Oral Once  . diphenhydrAMINE  25 mg Oral Once  . nicotine  21 mg Transdermal Daily    Continuous Infusions: . sodium chloride 1,000 mL (05/02/14 2350)    Past Medical History  Diagnosis Date  . Fibroid, uterine   . Anemia     History reviewed. No pertinent past surgical history.  Clayton Bibles, MS, RD, LDN Pager: [phone] After Hours Pager: [phone]

## 2014-05-03 NOTE — Progress Notes (Signed)
Lab results- Cbc called in to Dr. Ree Kida.

## 2014-05-03 NOTE — Discharge Instructions (Signed)
Anemia, Nonspecific Anemia is a condition in which the concentration of red blood cells or hemoglobin in the blood is below normal. Hemoglobin is a substance in red blood cells that carries oxygen to the tissues of the body. Anemia results in not enough oxygen reaching these tissues.  CAUSES  Common causes of anemia include:   Excessive bleeding. Bleeding may be internal or external. This includes excessive bleeding from periods (in women) or from the intestine.   Poor nutrition.   Chronic kidney, thyroid, and liver disease.  Bone marrow disorders that decrease red blood cell production.  Cancer and treatments for cancer.  HIV, AIDS, and their treatments.  Spleen problems that increase red blood cell destruction.  Blood disorders.  Excess destruction of red blood cells due to infection, medicines, and autoimmune disorders. SIGNS AND SYMPTOMS   Minor weakness.   Dizziness.   Headache.  Palpitations.   Shortness of breath, especially with exercise.   Paleness.  Cold sensitivity.  Indigestion.  Nausea.  Difficulty sleeping.  Difficulty concentrating. Symptoms may occur suddenly or they may develop slowly.  DIAGNOSIS  Additional blood tests are often needed. These help your health care provider determine the best treatment. Your health care provider will check your stool for blood and look for other causes of blood loss.  TREATMENT  Treatment varies depending on the cause of the anemia. Treatment can include:   Supplements of iron, vitamin B12, or folic acid.   Hormone medicines.   A blood transfusion. This may be needed if blood loss is severe.   Hospitalization. This may be needed if there is significant continual blood loss.   Dietary changes.  Spleen removal. HOME CARE INSTRUCTIONS Keep all follow-up appointments. It often takes many weeks to correct anemia, and having your health care provider check on your condition and your response to  treatment is very important. SEEK IMMEDIATE MEDICAL CARE IF:   You develop extreme weakness, shortness of breath, or chest pain.   You become dizzy or have trouble concentrating.  You develop heavy vaginal bleeding.   You develop a rash.   You have bloody or black, tarry stools.   You faint.   You vomit up blood.   You vomit repeatedly.   You have abdominal pain.  You have a fever or persistent symptoms for more than 2-3 days.   You have a fever and your symptoms suddenly get worse.   You are dehydrated.  MAKE SURE YOU:  Understand these instructions.  Will watch your condition.  Will get help right away if you are not doing well or get worse. Document Released: 03/18/2004 Document Revised: 10/11/2012 Document Reviewed: 08/04/2012 Exit[employer] Patient Information 2015 [employer]. This information is not intended to replace advice given to you by your health care provider. Make sure you discuss any questions you have with your health care provider.  

## 2014-05-03 NOTE — Progress Notes (Signed)
Patient d/c home,stable. 

## 2014-05-03 NOTE — Progress Notes (Signed)
UR completed 

## 2014-05-03 NOTE — Progress Notes (Signed)
Echocardiogram 2D Echocardiogram has been performed.  Karista Aispuro 05/03/2014, 10:11 AM

## 2014-05-03 NOTE — Plan of Care (Signed)
Problem: Phase I Progression Outcomes Goal: OOB as tolerated unless otherwise ordered Outcome: Progressing oob with assist with IV pole

## 2014-05-04 LAB — TYPE AND SCREEN
ABO/RH(D): A POS
Antibody Screen: NEGATIVE
Unit division: 0
Unit division: 0
Unit division: 0
Unit division: 0

## 2014-05-07 ENCOUNTER — Encounter: Payer: Self-pay | Admitting: Family Medicine

## 2014-05-07 ENCOUNTER — Ambulatory Visit: Payer: Medicaid Other | Attending: Family Medicine | Admitting: Family Medicine

## 2014-05-07 VITALS — BP 132/83 | HR 61 | Temp 97.7°F | Ht 71.0 in | Wt 160.6 lb

## 2014-05-07 DIAGNOSIS — D62 Acute posthemorrhagic anemia: Secondary | ICD-10-CM

## 2014-05-07 LAB — BASIC METABOLIC PANEL
BUN: 11 mg/dL (ref 6–23)
CHLORIDE: 103 meq/L (ref 96–112)
CO2: 26 meq/L (ref 19–32)
CREATININE: 0.55 mg/dL (ref 0.50–1.10)
Calcium: 9.1 mg/dL (ref 8.4–10.5)
GLUCOSE: 84 mg/dL (ref 70–99)
Potassium: 5 mEq/L (ref 3.5–5.3)
Sodium: 135 mEq/L (ref 135–145)

## 2014-05-07 MED ORDER — FERROUS SULFATE 325 (65 FE) MG PO TABS: 325.0000 mg | ORAL_TABLET | Freq: Every day | ORAL | Status: AC

## 2014-05-07 MED ORDER — ACETAMINOPHEN-CODEINE #3 300-30 MG PO TABS: 1.0000 | ORAL_TABLET | ORAL | Status: DC | PRN

## 2014-05-07 NOTE — Progress Notes (Signed)
Patient is here for hospital follow-up for abdominal pain. Pt states she was advised she has uterine fibroids and an ovarian cysts and needs a referral to GYN.  Pt states pain is located in lower abdomen and radiates to her lower back. She rates it a 6 on pain scale.

## 2014-05-07 NOTE — Patient Instructions (Signed)
See our financial people to arrange for financial help with referral to GYN Take medications as prescribe. Go to ED if necessaryAnemia, Nonspecific Anemia is a condition in which the concentration of red blood cells or hemoglobin in the blood is below normal. Hemoglobin is a substance in red blood cells that carries oxygen to the tissues of the body. Anemia results in not enough oxygen reaching these tissues.  CAUSES  Common causes of anemia include:   Excessive bleeding. Bleeding may be internal or external. This includes excessive bleeding from periods (in women) or from the intestine.   Poor nutrition.   Chronic kidney, thyroid, and liver disease.  Bone marrow disorders that decrease red blood cell production.  Cancer and treatments for cancer.  HIV, AIDS, and their treatments.  Spleen problems that increase red blood cell destruction.  Blood disorders.  Excess destruction of red blood cells due to infection, medicines, and autoimmune disorders. SIGNS AND SYMPTOMS   Minor weakness.   Dizziness.   Headache.  Palpitations.   Shortness of breath, especially with exercise.   Paleness.  Cold sensitivity.  Indigestion.  Nausea.  Difficulty sleeping.  Difficulty concentrating. Symptoms may occur suddenly or they may develop slowly.  DIAGNOSIS  Additional blood tests are often needed. These help your health care provider determine the best treatment. Your health care provider will check your stool for blood and look for other causes of blood loss.  TREATMENT  Treatment varies depending on the cause of the anemia. Treatment can include:   Supplements of iron, vitamin B63, or folic acid.   Hormone medicines.   A blood transfusion. This may be needed if blood loss is severe.   Hospitalization. This may be needed if there is significant continual blood loss.   Dietary changes.  Spleen removal. HOME CARE INSTRUCTIONS Keep all follow-up appointments.  It often takes many weeks to correct anemia, and having your health care provider check on your condition and your response to treatment is very important. SEEK IMMEDIATE MEDICAL CARE IF:   You develop extreme weakness, shortness of breath, or chest pain.   You become dizzy or have trouble concentrating.  You develop heavy vaginal bleeding.   You develop a rash.   You have bloody or black, tarry stools.   You faint.   You vomit up blood.   You vomit repeatedly.   You have abdominal pain.  You have a fever or persistent symptoms for more than 2-3 days.   You have a fever and your symptoms suddenly get worse.   You are dehydrated.  MAKE SURE YOU:  Understand these instructions.  Will watch your condition.  Will get help right away if you are not doing well or get worse. Document Released: 03/18/2004 Document Revised: 10/11/2012 Document Reviewed: 08/04/2012 [facility] Patient Information 2015 [place], [place]. This information is not intended to replace advice given to you by your health care provider. Make sure you discuss any questions you have with your health care provider.

## 2014-05-07 NOTE — Progress Notes (Signed)
Patient ID: Kendra [name], female   DOB: June 08, 1970, 44 y.o.   MRN: [id number] [facility] Complaint: Hospital f/u for anemia  Subjective: Patient presents today for a hospital follow-up for anemia. She was hospitalized from 3/10 to 3/11 and received two units of blood. She has a history of GYN problems with heavy periods, ovarian cysts uterine fibroids.A referral to GYN has been requested. She does not have insurance, orange card, or [facility] discount card. She reports no previous chronic conditions except for malnutrition.   ROS:  GEN: denies fever, chills, She has had a 60 pound weight loss over the last year. This was unintentional. Skin: denies lesions or rashes HEENT: denies  earache, epistaxis, sore throat, or neck pain, admits to some headaches.               LUNGS: Denies SOB with rest or walking. She did have some SOB prior to going to hospital, but that has resolved. CV: denies CP or palpitations, Has a murmur since birth. ABD: denies nausea and vomiting            EXT: denies muscle spasms or swelling; no pain in lower ext, no weakness NEURO: denies numbness or tingling, denies sz, Hx of stroke.  Objective:  Filed Vitals:   05/07/14 0930  BP: 132/83  Pulse: 61  Temp: 97.7 F (36.5 C)  TempSrc: Oral  Height: 5\' 11"  (1.803 m)  Weight: 160 lb 9.6 oz (72.848 kg)  SpO2: 100%    Physical Exam:  General: in no acute distress. HEENT: no pallor, no icterus, moist oral mucosa, no  lymphadenopathy Heart: Normal  s1 &s2  Regular rate and rhythm, without rubs, gallops.There is a 4/6 systolic murmur present Lungs: Clear to auscultation bilaterally. Abdomen: Soft,  nondistended, positive bowel sounds.There is left lower quadrant tenderness. Exetremeties: No pedal edema,pedal pulses normal. Neuro: Alert, awake, oriented x3, nonfocal.  Pertinent Lab Results: 05/03/14 Hbg 7.7, Hct 26.1, labs drawn today, Ferrous sulfate prescribed.  Medications: Prior to Admission medications   Medication  Sig Start Date End Date Taking? Authorizing Provider  medroxyPROGESTERone (PROVERA) 10 MG tablet Take 1 tablet (10 mg total) by mouth daily. 05/03/14  Yes Maryann Mikhail, DO  acetaminophen-codeine (TYLENOL #3) 300-30 MG per tablet Take 1-2 tablets by mouth every 4 (four) hours as needed for moderate pain (sparingly for more intense pain.). 05/07/14   Micheline Chapman, NP  feeding supplement, ENSURE COMPLETE, (ENSURE COMPLETE) LIQD Take 237 mLs by mouth 2 (two) times daily between meals. Patient not taking: Reported on 05/07/2014 05/03/14   Velta Addison Mikhail, DO  ferrous sulfate (FERROUSUL) 325 (65 FE) MG tablet Take 1 tablet (325 mg total) by mouth daily with breakfast. 05/07/14   Micheline Chapman, NP  nicotine (NICODERM CQ - DOSED IN MG/24 HOURS) 21 mg/24hr patch Place 1 patch (21 mg total) onto the skin daily. Patient not taking: Reported on 05/07/2014 05/03/14   Cristal Ford, DO    Assessment: 1. Anemia, from heady periods 2. Left lower quadrant pain( probably related to ovarian cyst  Plan: 1. Repeat CBC, start on Ferrous sulfate 2. Complete metabolic panel     To meet with financial to arrange for orange card or [facility] Discount Card.     Referral to GYN  Follow up:  To follow-up here with PCP in 2 weeks. The patient was given clear instructions to go to ER or return to medical center if symptoms don't improve, worsen or new problems develop. The patient verbalized understanding. The  patient was told to call to get lab results if they haven't heard anything in the next week.   This note has been created with Surveyor, quantity. Any transcriptional errors are unintentiona  Micheline Chapman. 05/07/2014, 12:29 PM

## 2014-05-08 LAB — CBC WITH DIFFERENTIAL/PLATELET
Basophils Absolute: 0 10*3/uL (ref 0.0–0.1)
Basophils Relative: 1 % (ref 0–1)
Eosinophils Absolute: 0.1 10*3/uL (ref 0.0–0.7)
Eosinophils Relative: 3 % (ref 0–5)
HCT: 30 % — ABNORMAL LOW (ref 36.0–46.0)
HEMOGLOBIN: 8.6 g/dL — AB (ref 12.0–15.0)
LYMPHS ABS: 1.2 10*3/uL (ref 0.7–4.0)
Lymphocytes Relative: 25 % (ref 12–46)
MCH: 19.6 pg — AB (ref 26.0–34.0)
MCHC: 28.7 g/dL — AB (ref 30.0–36.0)
MCV: 68.3 fL — AB (ref 78.0–100.0)
Monocytes Absolute: 0.5 10*3/uL (ref 0.1–1.0)
Monocytes Relative: 10 % (ref 3–12)
Neutro Abs: 2.9 10*3/uL (ref 1.7–7.7)
Neutrophils Relative %: 61 % (ref 43–77)
Platelets: 226 10*3/uL (ref 150–400)
RBC: 4.39 MIL/uL (ref 3.87–5.11)
RDW: 29.4 % — ABNORMAL HIGH (ref 11.5–15.5)
WBC: 4.7 10*3/uL (ref 4.0–10.5)

## 2014-05-13 ENCOUNTER — Telehealth: Payer: Self-pay | Admitting: Emergency Medicine

## 2014-05-13 ENCOUNTER — Telehealth: Payer: Self-pay | Admitting: Family Medicine

## 2014-05-13 NOTE — Telephone Encounter (Signed)
Left message for pt to continue Ferrous Sulfate and return in 1 month for f/u blood work

## 2014-05-13 NOTE — Telephone Encounter (Signed)
Pt called requesting blood work results, please f/u with pt

## 2014-05-14 ENCOUNTER — Telehealth: Payer: Self-pay | Admitting: *Deleted

## 2014-05-14 NOTE — Telephone Encounter (Signed)
-----   Message from Micheline Chapman, NP sent at 05/09/2014  2:14 PM EDT ----- Continues amenic.  Continue with Ferrous sulfate and have recheck in one month.

## 2014-05-14 NOTE — Telephone Encounter (Signed)
Spoke to patient and relayed NP message regarding her blood work.  Told her to see Dr. Annitta Needs on 3/28 and he will decide the next plan of action.  Patient in agreement.

## 2014-05-20 ENCOUNTER — Ambulatory Visit: Payer: Medicaid Other | Attending: Internal Medicine | Admitting: Internal Medicine

## 2014-05-20 ENCOUNTER — Encounter: Payer: Self-pay | Admitting: Internal Medicine

## 2014-05-20 VITALS — BP 148/84 | HR 86 | Temp 98.6°F | Resp 16 | Wt 158.6 lb

## 2014-05-20 DIAGNOSIS — N8320 Unspecified ovarian cysts: Secondary | ICD-10-CM | POA: Diagnosis not present

## 2014-05-20 DIAGNOSIS — F17210 Nicotine dependence, cigarettes, uncomplicated: Secondary | ICD-10-CM | POA: Insufficient documentation

## 2014-05-20 DIAGNOSIS — D62 Acute posthemorrhagic anemia: Secondary | ICD-10-CM | POA: Diagnosis not present

## 2014-05-20 DIAGNOSIS — N921 Excessive and frequent menstruation with irregular cycle: Secondary | ICD-10-CM

## 2014-05-20 DIAGNOSIS — IMO0001 Reserved for inherently not codable concepts without codable children: Secondary | ICD-10-CM | POA: Insufficient documentation

## 2014-05-20 DIAGNOSIS — R109 Unspecified abdominal pain: Secondary | ICD-10-CM | POA: Diagnosis not present

## 2014-05-20 DIAGNOSIS — Z1231 Encounter for screening mammogram for malignant neoplasm of breast: Secondary | ICD-10-CM

## 2014-05-20 DIAGNOSIS — Z720 Tobacco use: Secondary | ICD-10-CM

## 2014-05-20 DIAGNOSIS — R030 Elevated blood-pressure reading, without diagnosis of hypertension: Secondary | ICD-10-CM | POA: Diagnosis not present

## 2014-05-20 DIAGNOSIS — N83202 Unspecified ovarian cyst, left side: Secondary | ICD-10-CM | POA: Insufficient documentation

## 2014-05-20 DIAGNOSIS — Z793 Long term (current) use of hormonal contraceptives: Secondary | ICD-10-CM | POA: Insufficient documentation

## 2014-05-20 DIAGNOSIS — D259 Leiomyoma of uterus, unspecified: Secondary | ICD-10-CM

## 2014-05-20 DIAGNOSIS — F17200 Nicotine dependence, unspecified, uncomplicated: Secondary | ICD-10-CM | POA: Insufficient documentation

## 2014-05-20 DIAGNOSIS — I10 Essential (primary) hypertension: Secondary | ICD-10-CM | POA: Insufficient documentation

## 2014-05-20 MED ORDER — TRAMADOL HCL 50 MG PO TABS: 50.0000 mg | ORAL_TABLET | Freq: Three times a day (TID) | ORAL | Status: DC | PRN

## 2014-05-20 NOTE — Progress Notes (Signed)
MRN: [id number] Name: Kendra [name]  Sex: female Age: 44 y.o. DOB: 1970/12/21  Allergies: Review of patient's allergies indicates no known allergies.  Chief Complaint  Patient presents with  . Follow-up    HPI: Patient is 44 y.o. female who has history of  fibroids, was hospitalized this month with worsening abdominal pain and heavy bleeding, EMR reviewed, she was found to be anemic, had a CT scan of abdomen done which showed no acute process but had a left ovarian cyst, patient had symptomatic anemia and was transfused blood, she was discharged and advised to continue and supplement and was also prescribed Provera and she is seen by gynecologist. Patient also smokes cigarettes and was prescribed nicotine patch as per patient she is not using the patch and we'll try to quit on her own, she is still complaining of abdominal pain post prescribed Tylenol with codeine as per patient it does not help her with the symptoms denies any urinary symptoms denies any nausea vomiting denies any change in bowel habits, currently patient is taking iron pill one time a day. Patient is stopped taking Provera, as per patient he did not help or symptoms of bleeding.  Past Medical History  Diagnosis Date  . Fibroid, uterine   . Anemia     Past Surgical History  Procedure Laterality Date  . Dilation and curettage of uterus  2004      Medication List       This list is accurate as of: 05/20/14  5:26 PM.  Always use your most recent med list.               acetaminophen-codeine 300-30 MG per tablet  Commonly known as:  TYLENOL #3  Take 1-2 tablets by mouth every 4 (four) hours as needed for moderate pain (sparingly for more intense pain.).     feeding supplement (ENSURE COMPLETE) Liqd  Take 237 mLs by mouth 2 (two) times daily between meals.     ferrous sulfate 325 (65 FE) MG tablet  Commonly known as:  FERROUSUL  Take 1 tablet (325 mg total) by mouth daily with breakfast.     medroxyPROGESTERone 10 MG tablet  Commonly known as:  PROVERA  Take 1 tablet (10 mg total) by mouth daily.     nicotine 21 mg/24hr patch  Commonly known as:  NICODERM CQ - dosed in mg/24 hours  Place 1 patch (21 mg total) onto the skin daily.     traMADol 50 MG tablet  Commonly known as:  ULTRAM  Take 1 tablet (50 mg total) by mouth every 8 (eight) hours as needed for moderate pain.        Meds ordered this encounter  Medications  . traMADol (ULTRAM) 50 MG tablet    Sig: Take 1 tablet (50 mg total) by mouth every 8 (eight) hours as needed for moderate pain.    Dispense:  30 tablet    Refill:  0     There is no immunization history on file for this patient.  Family History  Problem Relation Age of Onset  . Hypertension Mother   . Heart disease Father   . Cancer Maternal Grandfather     History  Substance Use Topics  . Smoking status: Current Every Day Smoker -- 1.00 packs/day for 30 years    Types: Cigarettes  . Smokeless tobacco: Not on file  . Alcohol Use: No    Review of Systems   As noted in HPI  Filed Vitals:   05/20/14 1648  BP: 148/84  Pulse: 86  Temp: 98.6 F (37 C)  Resp: 16    Physical Exam  Physical Exam  Constitutional: No distress.  Eyes: EOM are normal. Pupils are equal, round, and reactive to light.  Neck: Neck supple.  Cardiovascular: Normal rate and regular rhythm.   Pulmonary/Chest: Breath sounds normal. No respiratory distress. She has no wheezes. She has no rales.  Abdominal:  Minimal tenderness with deep palpation in left upper and left lower quadrant, no rebound or guarding, bowel sounds positive  Musculoskeletal: She exhibits no edema.    CBC    Component Value Date/Time   WBC 4.7 05/07/2014 1029   RBC 4.39 05/07/2014 1029   RBC 4.01 05/03/2014 1122   HGB 8.6* 05/07/2014 1029   HCT 30.0* 05/07/2014 1029   PLT 226 05/07/2014 1029   MCV 68.3* 05/07/2014 1029   LYMPHSABS 1.2 05/07/2014 1029   MONOABS 0.5 05/07/2014 1029    EOSABS 0.1 05/07/2014 1029   BASOSABS 0.0 05/07/2014 1029    CMP     Component Value Date/Time   NA 135 05/07/2014 1029   K 5.0 05/07/2014 1029   CL 103 05/07/2014 1029   CO2 26 05/07/2014 1029   GLUCOSE 84 05/07/2014 1029   BUN 11 05/07/2014 1029   CREATININE 0.55 05/07/2014 1029   CREATININE 0.52 05/03/2014 1122   CALCIUM 9.1 05/07/2014 1029   PROT 7.1 05/02/2014 1344   ALBUMIN 4.0 05/02/2014 1344   AST 13 05/02/2014 1344   ALT 13 05/02/2014 1344   ALKPHOS 48 05/02/2014 1344   BILITOT 0.5 05/02/2014 1344   GFRNONAA >90 05/03/2014 1122   GFRAA >90 05/03/2014 1122    No results found for: CHOL  No components found for: HGA1C  Lab Results  Component Value Date/Time   AST 13 05/02/2014 01:44 PM    Assessment and Plan  Uterine leiomyoma, unspecified location - Plan: Ambulatory referral to Gynecology  Acute blood loss anemia I have advised patient to take iron supplement 3 times a day, will repeat CBC in 3 months  Elevated BP Advised patient for DASH diet.  Smoking Counseled patient to quit smoking.  Left ovarian cyst - Plan: Ambulatory referral to Gynecology  Abdominal pain, unspecified abdominal location - Plan: traMADol (ULTRAM) 50 MG tablet  Menorrhagia with irregular cycle - Plan: Ambulatory referral to Gynecology   Health Maintenance  -Pap Smear:referred to GYN -Mammogram:ordered -Vaccinations:  Patient declined flu shot/Pneumovax  Return in about 3 months (around 08/20/2014) for anemia.   This note has been created with Surveyor, quantity. Any transcriptional errors are unintentional.    Lorayne Marek, MD

## 2014-05-20 NOTE — Patient Instructions (Signed)
Smoking Cessation Quitting smoking is important to your health and has many advantages. However, it is not always easy to quit since nicotine is a very addictive drug. Oftentimes, people try 3 times or more before being able to quit. This document explains the best ways for you to prepare to quit smoking. Quitting takes hard work and a lot of effort, but you can do it. ADVANTAGES OF QUITTING SMOKING  You will live longer, feel better, and live better.  Your body will feel the impact of quitting smoking almost immediately.  Within 20 minutes, blood pressure decreases. Your pulse returns to its normal level.  After 8 hours, carbon monoxide levels in the blood return to normal. Your oxygen level increases.  After 24 hours, the chance of having a heart attack starts to decrease. Your breath, hair, and body stop smelling like smoke.  After 48 hours, damaged nerve endings begin to recover. Your sense of taste and smell improve.  After 72 hours, the body is virtually free of nicotine. Your bronchial tubes relax and breathing becomes easier.  After 2 to 12 weeks, lungs can hold more air. Exercise becomes easier and circulation improves.  The risk of having a heart attack, stroke, cancer, or lung disease is greatly reduced.  After 1 year, the risk of coronary heart disease is cut in half.  After 5 years, the risk of stroke falls to the same as a nonsmoker.  After 10 years, the risk of lung cancer is cut in half and the risk of other cancers decreases significantly.  After 15 years, the risk of coronary heart disease drops, usually to the level of a nonsmoker.  If you are pregnant, quitting smoking will improve your chances of having a healthy baby.  The people you live with, especially any children, will be healthier.  You will have extra money to spend on things other than cigarettes. QUESTIONS TO THINK ABOUT BEFORE ATTEMPTING TO QUIT You may want to talk about your answers with your  health care provider.  Why do you want to quit?  If you tried to quit in the past, what helped and what did not?  What will be the most difficult situations for you after you quit? How will you plan to handle them?  Who can help you through the tough times? Your family? Friends? A health care provider?  What pleasures do you get from smoking? What ways can you still get pleasure if you quit? Here are some questions to ask your health care provider:  How can you help me to be successful at quitting?  What medicine do you think would be best for me and how should I take it?  What should I do if I need more help?  What is smoking withdrawal like? How can I get information on withdrawal? GET READY  Set a quit date.  Change your environment by getting rid of all cigarettes, ashtrays, matches, and lighters in your home, car, or work. Do not let people smoke in your home.  Review your past attempts to quit. Think about what worked and what did not. GET SUPPORT AND ENCOURAGEMENT You have a better chance of being successful if you have help. You can get support in many ways.  Tell your family, friends, and coworkers that you are going to quit and need their support. Ask them not to smoke around you.  Get individual, group, or telephone counseling and support. Programs are available at local hospitals and health centers. Call   your local health department for information about programs in your area.  Spiritual beliefs and practices may help some smokers quit.  Download a "quit meter" on your computer to keep track of quit statistics, such as how long you have gone without smoking, cigarettes not smoked, and money saved.  Get a self-help book about quitting smoking and staying off tobacco. LEARN NEW SKILLS AND BEHAVIORS  Distract yourself from urges to smoke. Talk to someone, go for a walk, or occupy your time with a task.  Change your normal routine. Take a different route to work.  Drink tea instead of coffee. Eat breakfast in a different place.  Reduce your stress. Take a hot bath, exercise, or read a book.  Plan something enjoyable to do every day. Reward yourself for not smoking.  Explore interactive web-based programs that specialize in helping you quit. GET MEDICINE AND USE IT CORRECTLY Medicines can help you stop smoking and decrease the urge to smoke. Combining medicine with the above behavioral methods and support can greatly increase your chances of successfully quitting smoking.  Nicotine replacement therapy helps deliver nicotine to your body without the negative effects and risks of smoking. Nicotine replacement therapy includes nicotine gum, lozenges, inhalers, nasal sprays, and skin patches. Some may be available over-the-counter and others require a prescription.  Antidepressant medicine helps people abstain from smoking, but how this works is unknown. This medicine is available by prescription.  Nicotinic receptor partial agonist medicine simulates the effect of nicotine in your brain. This medicine is available by prescription. Ask your health care provider for advice about which medicines to use and how to use them based on your health history. Your health care provider will tell you what side effects to look out for if you choose to be on a medicine or therapy. Carefully read the information on the package. Do not use any other product containing nicotine while using a nicotine replacement product.  RELAPSE OR DIFFICULT SITUATIONS Most relapses occur within the first 3 months after quitting. Do not be discouraged if you start smoking again. Remember, most people try several times before finally quitting. You may have symptoms of withdrawal because your body is used to nicotine. You may crave cigarettes, be irritable, feel very hungry, cough often, get headaches, or have difficulty concentrating. The withdrawal symptoms are only temporary. They are strongest  when you first quit, but they will go away within 10-14 days. To reduce the chances of relapse, try to:  Avoid drinking alcohol. Drinking lowers your chances of successfully quitting.  Reduce the amount of caffeine you consume. Once you quit smoking, the amount of caffeine in your body increases and can give you symptoms, such as a rapid heartbeat, sweating, and anxiety.  Avoid smokers because they can make you want to smoke.  Do not let weight gain distract you. Many smokers will gain weight when they quit, usually less than 10 pounds. Eat a healthy diet and stay active. You can always lose the weight gained after you quit.  Find ways to improve your mood other than smoking. FOR MORE INFORMATION  www.smokefree.gov  Document Released: 02/02/2001 Document Revised: 06/25/2013 Document Reviewed: 05/20/2011 ExitCare Patient Information 2015 [employer]. This information is not intended to replace advice given to you by your health care provider. Make sure you discuss any questions you have with your health care provider. DASH Eating Plan DASH stands for "Dietary Approaches to Stop Hypertension." The DASH eating plan is a healthy eating plan that has   been shown to reduce high blood pressure (hypertension). Additional health benefits may include reducing the risk of type 2 diabetes mellitus, heart disease, and stroke. The DASH eating plan may also help with weight loss. WHAT DO I NEED TO KNOW ABOUT THE DASH EATING PLAN? For the DASH eating plan, you will follow these general guidelines:  Choose foods with a percent daily value for sodium of less than 5% (as listed on the food label).  Use salt-free seasonings or herbs instead of table salt or sea salt.  Check with your health care provider or pharmacist before using salt substitutes.  Eat lower-sodium products, often labeled as "lower sodium" or "no salt added."  Eat fresh foods.  Eat more vegetables, fruits, and low-fat dairy  products.  Choose whole grains. Look for the word "whole" as the first word in the ingredient list.  Choose fish and skinless chicken or turkey more often than red meat. Limit fish, poultry, and meat to 6 oz (170 g) each day.  Limit sweets, desserts, sugars, and sugary drinks.  Choose heart-healthy fats.  Limit cheese to 1 oz (28 g) per day.  Eat more home-cooked food and less restaurant, buffet, and fast food.  Limit fried foods.  Cook foods using methods other than frying.  Limit canned vegetables. If you do use them, rinse them well to decrease the sodium.  When eating at a restaurant, ask that your food be prepared with less salt, or no salt if possible. WHAT FOODS CAN I EAT? Seek help from a dietitian for individual calorie needs. Grains Whole grain or whole wheat bread. Brown rice. Whole grain or whole wheat pasta. Quinoa, bulgur, and whole grain cereals. Low-sodium cereals. Corn or whole wheat flour tortillas. Whole grain cornbread. Whole grain crackers. Low-sodium crackers. Vegetables Fresh or frozen vegetables (raw, steamed, roasted, or grilled). Low-sodium or reduced-sodium tomato and vegetable juices. Low-sodium or reduced-sodium tomato sauce and paste. Low-sodium or reduced-sodium canned vegetables.  Fruits All fresh, canned (in natural juice), or frozen fruits. Meat and Other Protein Products Ground beef (85% or leaner), grass-fed beef, or beef trimmed of fat. Skinless chicken or turkey. Ground chicken or turkey. Pork trimmed of fat. All fish and seafood. Eggs. Dried beans, peas, or lentils. Unsalted nuts and seeds. Unsalted canned beans. Dairy Low-fat dairy products, such as skim or 1% milk, 2% or reduced-fat cheeses, low-fat ricotta or cottage cheese, or plain low-fat yogurt. Low-sodium or reduced-sodium cheeses. Fats and Oils Tub margarines without trans fats. Light or reduced-fat mayonnaise and salad dressings (reduced sodium). Avocado. Safflower, olive, or canola  oils. Natural peanut or almond butter. Other Unsalted popcorn and pretzels. The items listed above may not be a complete list of recommended foods or beverages. Contact your dietitian for more options. WHAT FOODS ARE NOT RECOMMENDED? Grains White bread. White pasta. White rice. Refined cornbread. Bagels and croissants. Crackers that contain trans fat. Vegetables Creamed or fried vegetables. Vegetables in a cheese sauce. Regular canned vegetables. Regular canned tomato sauce and paste. Regular tomato and vegetable juices. Fruits Dried fruits. Canned fruit in light or heavy syrup. Fruit juice. Meat and Other Protein Products Fatty cuts of meat. Ribs, chicken wings, bacon, sausage, bologna, salami, chitterlings, fatback, hot dogs, bratwurst, and packaged luncheon meats. Salted nuts and seeds. Canned beans with salt. Dairy Whole or 2% milk, cream, half-and-half, and cream cheese. Whole-fat or sweetened yogurt. Full-fat cheeses or blue cheese. Nondairy creamers and whipped toppings. Processed cheese, cheese spreads, or cheese curds. Condiments Onion and garlic salt,   seasoned salt, table salt, and sea salt. Canned and packaged gravies. Worcestershire sauce. Tartar sauce. Barbecue sauce. Teriyaki sauce. Soy sauce, including reduced sodium. Steak sauce. Fish sauce. Oyster sauce. Cocktail sauce. Horseradish. Ketchup and mustard. Meat flavorings and tenderizers. Bouillon cubes. Hot sauce. Tabasco sauce. Marinades. Taco seasonings. Relishes. Fats and Oils Butter, stick margarine, lard, shortening, ghee, and bacon fat. Coconut, palm kernel, or palm oils. Regular salad dressings. Other Pickles and olives. Salted popcorn and pretzels. The items listed above may not be a complete list of foods and beverages to avoid. Contact your dietitian for more information. WHERE CAN I FIND MORE INFORMATION? National Heart, Lung, and [employer] Institute: www.nhlbi.nih.gov/health/health-topics/topics/dash/ Document Released:  01/28/2011 Document Revised: 06/25/2013 Document Reviewed: 12/13/2012 ExitCare Patient Information 2015 [employer]. This information is not intended to replace advice given to you by your health care provider. Make sure you discuss any questions you have with your health care provider.  

## 2014-05-20 NOTE — Progress Notes (Signed)
Patient here for follow up from the hospital Patient was diagnosed with anemia and required a blood transfusion Patient was prescribed provera but stopped taking the medication because it causes her Menstrual cycle to be very heavy

## 2014-05-28 ENCOUNTER — Encounter: Payer: Self-pay | Admitting: Obstetrics & Gynecology

## 2014-05-30 ENCOUNTER — Ambulatory Visit: Payer: Self-pay | Attending: Internal Medicine

## 2014-06-26 ENCOUNTER — Other Ambulatory Visit (HOSPITAL_COMMUNITY)
Admission: RE | Admit: 2014-06-26 | Discharge: 2014-06-26 | Disposition: A | Discharge status: Home / Self Care | Payer: MEDICAID | Source: Ambulatory Visit | Attending: Obstetrics & Gynecology | Admitting: Obstetrics & Gynecology

## 2014-06-26 ENCOUNTER — Encounter: Payer: Self-pay | Admitting: Obstetrics & Gynecology

## 2014-06-26 ENCOUNTER — Ambulatory Visit (INDEPENDENT_AMBULATORY_CARE_PROVIDER_SITE_OTHER): Payer: Self-pay | Admitting: Obstetrics & Gynecology

## 2014-06-26 ENCOUNTER — Other Ambulatory Visit: Payer: Self-pay | Admitting: Obstetrics & Gynecology

## 2014-06-26 VITALS — BP 150/64 | HR 68 | Temp 98.8°F | Ht 71.0 in | Wt 161.9 lb

## 2014-06-26 DIAGNOSIS — N939 Abnormal uterine and vaginal bleeding, unspecified: Secondary | ICD-10-CM

## 2014-06-26 DIAGNOSIS — N83202 Unspecified ovarian cyst, left side: Secondary | ICD-10-CM

## 2014-06-26 DIAGNOSIS — N8320 Unspecified ovarian cysts: Secondary | ICD-10-CM

## 2014-06-26 LAB — CBC
HCT: 43 % (ref 36.0–46.0)
HEMOGLOBIN: 14 g/dL (ref 12.0–15.0)
MCH: 26.8 pg (ref 26.0–34.0)
MCHC: 32.6 g/dL (ref 30.0–36.0)
MCV: 82.4 fL (ref 78.0–100.0)
PLATELETS: 281 10*3/uL (ref 150–400)
RBC: 5.22 MIL/uL — AB (ref 3.87–5.11)
RDW: 20.2 % — ABNORMAL HIGH (ref 11.5–15.5)
WBC: 6.9 10*3/uL (ref 4.0–10.5)

## 2014-06-26 LAB — POCT PREGNANCY, URINE: Preg Test, Ur: NEGATIVE

## 2014-06-26 MED ORDER — NAPROXEN 500 MG PO TABS: 500.0000 mg | ORAL_TABLET | Freq: Two times a day (BID) | ORAL | Status: DC

## 2014-06-26 MED ORDER — MEGESTROL ACETATE 40 MG PO TABS: 40.0000 mg | ORAL_TABLET | Freq: Two times a day (BID) | ORAL | Status: DC

## 2014-06-26 NOTE — Progress Notes (Signed)
Pelvic U/S scheduled for 07/02/14 at 1415. Pt. Advised to arrive 15 minutes early.

## 2014-06-26 NOTE — Patient Instructions (Signed)
Dysfunctional Uterine Bleeding Normally, menstrual periods begin between ages 11 to 17 in young women. A normal menstrual cycle/period may begin every 23 days up to 35 days and lasts from 1 to 7 days. Around 12 to 14 days before your menstrual period starts, ovulation (ovary produces an egg) occurs. When counting the time between menstrual periods, count from the first day of bleeding of the previous period to the first day of bleeding of the next period. Dysfunctional (abnormal) uterine bleeding is bleeding that is different from a normal menstrual period. Your periods may come earlier or later than usual. They may be lighter, have blood clots or be heavier. You may have bleeding between periods, or you may skip one period or more. You may have bleeding after sexual intercourse, bleeding after menopause, or no menstrual period. CAUSES   Pregnancy (normal, miscarriage, tubal).  IUDs (intrauterine device, birth control).  Birth control pills.  Hormone treatment.  Menopause.  Infection of the cervix.  Blood clotting problems.  Infection of the inside lining of the uterus.  Endometriosis, inside lining of the uterus growing in the pelvis and other female organs.  Adhesions (scar tissue) inside the uterus.  Obesity or severe weight loss.  Uterine polyps inside the uterus.  Cancer of the vagina, cervix, or uterus.  Ovarian cysts or polycystic ovary syndrome.  Medical problems (diabetes, thyroid disease).  Uterine fibroids (noncancerous tumor).  Problems with your female hormones.  Endometrial hyperplasia, very thick lining and enlarged cells inside of the uterus.  Medicines that interfere with ovulation.  Radiation to the pelvis or abdomen.  Chemotherapy. DIAGNOSIS   Your doctor will discuss the history of your menstrual periods, medicines you are taking, changes in your weight, stress in your life, and any medical problems you may have.  Your doctor will do a physical  and pelvic examination.  Your doctor may want to perform certain tests to make a diagnosis, such as:  Pap test.  Blood tests.  Cultures for infection.  CT scan.  Ultrasound.  Hysteroscopy.  Laparoscopy.  MRI.  Hysterosalpingography.  D and C.  Endometrial biopsy. TREATMENT  Treatment will depend on the cause of the dysfunctional uterine bleeding (DUB). Treatment may include:  Observing your menstrual periods for a couple of months.  Prescribing medicines for medical problems, including:  Antibiotics.  Hormones.  Birth control pills.  Removing an IUD (intrauterine device, birth control).  Surgery:  D and C (scrape and remove tissue from inside the uterus).  Laparoscopy (examine inside the abdomen with a lighted tube).  Uterine ablation (destroy lining of the uterus with electrical current, laser, heat, or freezing).  Hysteroscopy (examine cervix and uterus with a lighted tube).  Hysterectomy (remove the uterus). HOME CARE INSTRUCTIONS   If medicines were prescribed, take exactly as directed. Do not change or switch medicines without consulting your caregiver.  Long term heavy bleeding may result in iron deficiency. Your caregiver may have prescribed iron pills. They help replace the iron that your body lost from heavy bleeding. Take exactly as directed.  Do not take aspirin or medicines that contain aspirin one week before or during your menstrual period. Aspirin may make the bleeding worse.  If you need to change your sanitary pad or tampon more than once every 2 hours, stay in bed with your feet elevated and a cold pack on your lower abdomen. Rest as much as possible, until the bleeding stops or slows down.  Eat well-balanced meals. Eat foods high in iron. Examples   are:  Leafy green vegetables.  Whole-grain breads and cereals.  Eggs.  Meat.  Liver.  Do not try to lose weight until the abnormal bleeding has stopped and your blood iron level is  back to normal. Do not lift more than ten pounds or do strenuous activities when you are bleeding.  For a couple of months, make note on your calendar, marking the start and ending of your period, and the type of bleeding (light, medium, heavy, spotting, clots or missed periods). This is for your caregiver to better evaluate your problem. SEEK MEDICAL CARE IF:   You develop nausea (feeling sick to your stomach) and vomiting, dizziness, or diarrhea while you are taking your medicine.  You are getting lightheaded or weak.  You have any problems that may be related to the medicine you are taking.  You develop pain with your DUB.  You want to remove your IUD.  You want to stop or change your birth control pills or hormones.  You have any type of abnormal bleeding mentioned above.  You are over 16 years old and have not had a menstrual period yet.  You are 44 years old and you are still having menstrual periods.  You have any of the symptoms mentioned above.  You develop a rash. SEEK IMMEDIATE MEDICAL CARE IF:   An oral temperature above 102 F (38.9 C) develops.  You develop chills.  You are changing your sanitary pad or tampon more than once an hour.  You develop abdominal pain.  You pass out or faint. Document Released: 02/06/2000 Document Revised: 05/03/2011 Document Reviewed: 01/07/2009 ExitCare Patient Information 2015 [employer]. This information is not intended to replace advice given to you by your health care provider. Make sure you discuss any questions you have with your health care provider.  

## 2014-06-26 NOTE — Progress Notes (Signed)
CLINIC ENCOUNTER NOTE  History:  44 y.o. G1P1001 here today for evaluation of AUB.  She was admitted for symptomatic anemia with Hgb of 4 in 04/2014 and was transfused with 4 units of pRBCs.  She reports having very heavy periods over the past few years, attributes this to fibroids.  Reports feeling tired all of the time and weak. No current bleeding.  Also reports associated pelvic pain and cramping, no nausea, no vomiting.    Past Medical History  Diagnosis Date  . Fibroid, uterine   . Anemia     Past Surgical History  Procedure Laterality Date  . Dilation and curettage of uterus  2004    The following portions of the patient's history were reviewed and updated as appropriate: allergies, current medications, past family history, past medical history, past social history, past surgical history and problem list.   Health Maintenance: Cannot remember when she had her last pap.  Review of Systems:  Pertinent items are noted in HPI. Comprehensive review of systems was otherwise negative.   Objective:  Physical Exam BP 150/64 mmHg  Pulse 68  Temp(Src) 98.8 F (37.1 C) (Oral)  Ht 5\' 11"  (1.803 m)  Wt 161 lb 14.4 oz (73.437 kg)  BMI 22.59 kg/m2  LMP 06/09/2014 CONSTITUTIONAL: Well-developed, well-nourished female in no acute distress.  HENT:  Normocephalic, atraumatic, External right and left ear normal. Oropharynx is clear and moist EYES: Conjunctivae and EOM are normal. Pupils are equal, round, and reactive to light. No scleral icterus.  NECK: Normal range of motion, supple, no masses.  Normal thyroid.  SKIN: Skin is warm and dry. No rash noted. Not diaphoretic. No erythema. No pallor. [place]: Alert and oriented to person, place, and time. Normal reflexes, muscle tone coordination. No cranial nerve deficit noted. PSYCHIATRIC: Normal mood and affect. Normal behavior. Normal judgment and thought content. CARDIOVASCULAR: Normal heart rate noted, regular rhythm RESPIRATORY:  Clear to auscultation bilaterally. Effort and breath sounds normal, no problems with respiration noted. ABDOMEN: Soft, normal bowel sounds, no distention noted.  No tenderness, rebound or guarding.  PELVIC: Normal appearing external genitalia; normal appearing vaginal mucosa and cervix.  Normal appearing discharge.  Pap smear obtained.  Normal uterine size, no other palpable masses, no uterine or adnexal tenderness. MUSCULOSKELETAL: Normal range of motion. No tenderness.  No cyanosis, clubbing, or edema.  2+ distal pulses.  ENDOMETRIAL BIOPSY     The indications for endometrial biopsy were reviewed.   Risks of the biopsy including cramping, bleeding, infection, uterine perforation, inadequate specimen and need for additional procedures  were discussed. The patient states she understands and agrees to undergo procedure today. Consent was signed. Time out was performed. Urine HCG was negative. During the pelvic exam, the cervix was prepped with Betadine. A single-toothed tenaculum was placed on the anterior lip of the cervix to stabilize it. The 3 mm pipelle was introduced into the endometrial cavity without difficulty to a depth of 8 cm, and a moderate amount of tissue was obtained and sent to pathology. The instruments were removed from the patient's vagina. Minimal bleeding from the cervix was noted. The patient tolerated the procedure well. Routine post-procedure instructions were given to the patient.     Labs and Imaging 05/02/2014   CLINICAL DATA:  60 lb weight loss over the last 8 months. Nausea with eating. Palpable knot on the posterior left-sided the abdomen. History of fibroids, anemia and leukopenia. Initial encounter.  EXAM: CT ABDOMEN AND PELVIS WITH CONTRAST  TECHNIQUE: Multidetector  CT imaging of the abdomen and pelvis was performed using the standard protocol following bolus administration of intravenous contrast.  CONTRAST:  123mL OMNIPAQUE IOHEXOL 300 MG/ML  SOLN  COMPARISON:  Pelvic  ultrasound 07/02/2010.  FINDINGS: Lower chest: Clear lung bases. No significant pleural or pericardial effusion.  Hepatobiliary: The liver is normal in density without focal abnormality. No evidence of gallstones, gallbladder wall thickening or biliary dilatation.  Pancreas: Unremarkable. No pancreatic ductal dilatation or surrounding inflammatory changes.  Spleen: The spleen is enlarged, measuring 14.6 x 9.1 x 8.5 cm for an estimated volume of 565 ml. No focal splenic abnormality demonstrated.  Adrenals/Urinary Tract: Both adrenal glands appear normal.The kidneys appear normal without evidence of urinary tract calculus, suspicious lesion or hydronephrosis. No bladder abnormalities are seen.  Stomach/Bowel: No evidence of bowel wall thickening, distention or surrounding inflammatory change.  Vascular/Lymphatic: There are no enlarged abdominal or pelvic lymph nodes. No significant vascular findings are present.  Reproductive: 2.9 cm calcified right uterine fibroid noted. Water density left adnexal lesion measuring 5.3 x 4.3 cm is most consistent with a functional cyst of the left ovary. The right ovary appears normal. There is no suspicious adnexal finding or pelvic inflammatory process.  Other: No evidence of abdominal wall mass or hernia.  Musculoskeletal: No acute or significant osseous findings.  IMPRESSION: 1. No abdominal wall mass or acute findings demonstrated within the abdomen or pelvis. 2. Mild nonspecific splenomegaly. This may contribute to the patient's anemia and leukopenia. Further hematologic assessment may be warranted if not previously performed. 3. 5.3 cm left ovarian cyst, likely functional. Because of size greater than 5 cm, current guidelines call for ultrasound follow up in 1 year to further evaluate. This recommendation follows ACR consensus guidelines: White Paper of the ACR Incidental Findings Committee II on Adnexal Findings. J Am Coll Radiol [phone].   Electronically Signed   By:  Richardean Sale M.D.   On: 05/02/2014 17:18    Assessment & Plan:  Abnormal uterine bleeding, left adnexal cyst Will follow up endometrial biopsy and pap smear CBC and TSH checked today Pelvic ultrasound ordered for more characterization of uterus and adnexa Megace prescribed for bleeding; precautions reviewed. Naproxen prescribed for pain. Patient will be contacted with results and plans for further care.   Total face-to-face time with patient: 30 minutes. Over 50% of encounter was spent on counseling and coordination of care.   Verita Schneiders, MD, [place] Attending [place] for [employer], [place]

## 2014-06-27 LAB — TSH: TSH: 0.329 u[IU]/mL — ABNORMAL LOW (ref 0.350–4.500)

## 2014-06-27 LAB — T4, FREE: FREE T4: 1.19 ng/dL (ref 0.80–1.80)

## 2014-06-27 LAB — T3, FREE: T3, Free: 3 pg/mL (ref 2.3–4.2)

## 2014-07-01 ENCOUNTER — Telehealth: Payer: Self-pay | Admitting: *Deleted

## 2014-07-01 LAB — CYTOLOGY - PAP

## 2014-07-01 NOTE — Telephone Encounter (Signed)
Patient informed of results.  

## 2014-07-01 NOTE — Telephone Encounter (Signed)
-----   Message from Osborne Oman, MD sent at 07/01/2014  8:50 AM EDT ----- Benign endometrial biopsy. Please call to inform patient of results.

## 2014-07-02 ENCOUNTER — Ambulatory Visit (HOSPITAL_COMMUNITY)
Admission: RE | Admit: 2014-07-02 | Discharge: 2014-07-02 | Disposition: A | Discharge status: Home / Self Care | Payer: Self-pay | Source: Ambulatory Visit | Attending: Obstetrics & Gynecology | Admitting: Obstetrics & Gynecology

## 2014-07-02 DIAGNOSIS — N939 Abnormal uterine and vaginal bleeding, unspecified: Secondary | ICD-10-CM | POA: Insufficient documentation

## 2014-07-02 DIAGNOSIS — D259 Leiomyoma of uterus, unspecified: Secondary | ICD-10-CM | POA: Insufficient documentation

## 2014-07-02 DIAGNOSIS — N83202 Unspecified ovarian cyst, left side: Secondary | ICD-10-CM

## 2014-07-03 ENCOUNTER — Telehealth: Payer: Self-pay

## 2014-07-03 ENCOUNTER — Other Ambulatory Visit: Payer: Self-pay

## 2014-07-03 NOTE — Telephone Encounter (Signed)
Called patient and informed her of results and recommendations. Patient would like a visit to discuss options. Informed her she will receive a call from front office staff within the next couple of days-- advised she call clinic if she has not heard of appointment by Monday. Message sent to admin pool.

## 2014-07-03 NOTE — Telephone Encounter (Signed)
-----   Message from Osborne Oman, MD sent at 07/03/2014  9:44 AM EDT ----- Small uterus and small fibroids.  Patient can make appointment with me to discuss long-term management of her symptoms.  Please call to inform patient of results and recommendations.

## 2014-07-31 ENCOUNTER — Encounter: Payer: Self-pay | Admitting: Obstetrics & Gynecology

## 2014-07-31 ENCOUNTER — Ambulatory Visit (INDEPENDENT_AMBULATORY_CARE_PROVIDER_SITE_OTHER): Payer: Self-pay | Admitting: Obstetrics & Gynecology

## 2014-07-31 VITALS — BP 137/83 | HR 67 | Temp 98.4°F | Ht 71.0 in | Wt 164.6 lb

## 2014-07-31 DIAGNOSIS — D649 Anemia, unspecified: Secondary | ICD-10-CM

## 2014-07-31 DIAGNOSIS — D259 Leiomyoma of uterus, unspecified: Secondary | ICD-10-CM

## 2014-07-31 DIAGNOSIS — D219 Benign neoplasm of connective and other soft tissue, unspecified: Secondary | ICD-10-CM | POA: Insufficient documentation

## 2014-07-31 DIAGNOSIS — Z01818 Encounter for other preprocedural examination: Secondary | ICD-10-CM | POA: Insufficient documentation

## 2014-07-31 DIAGNOSIS — N939 Abnormal uterine and vaginal bleeding, unspecified: Secondary | ICD-10-CM | POA: Insufficient documentation

## 2014-07-31 NOTE — Progress Notes (Signed)
CLINIC ENCOUNTER NOTE  History:  44 y.o. G1P1001 here today for discussion of results after studies were done to evaluate AUB. Was seen on 06/26/14, started on Megace. No bleeding since she started on Megace.  Denies any abdominal pain, abnormal bleeding, vaginal discharge or other concerns.   Past Medical History  Diagnosis Date  . Fibroid, uterine   . Anemia     Past Surgical History  Procedure Laterality Date  . Dilation and curettage of uterus  2004   The following portions of the patient's history were reviewed and updated as appropriate: allergies, current medications, past family history, past medical history, past social history, past surgical history and problem list.   Health Maintenance:  Normal pap and negative HRHPV on 06/26/2014.  Mammogram has been ordered for patient.   Review of Systems:  Pertinent items are noted in HPI. Comprehensive review of systems was otherwise negative.  Objective:  Physical Exam BP 137/83 mmHg  Pulse 67  Temp(Src) 98.4 F (36.9 C) (Oral)  Ht 5\' 11"  (1.803 m)  Wt 164 lb 9.6 oz (74.662 kg)  BMI 22.97 kg/m2  LMP 06/09/2014 CONSTITUTIONAL: Well-developed, well-nourished female in no acute distress.  HENT:  Normocephalic, atraumatic, External right and left ear normal. Oropharynx is clear and moist EYES: Conjunctivae and EOM are normal. Pupils are equal, round, and reactive to light. No scleral icterus.  NECK: Normal range of motion, supple, no masses SKIN: Skin is warm and dry. No rash noted. Not diaphoretic. No erythema. No pallor. [place]: Alert and oriented to person, place, and time. Normal reflexes, muscle tone coordination. No cranial nerve deficit noted. PSYCHIATRIC: Normal mood and affect. Normal behavior. Normal judgment and thought content. CARDIOVASCULAR: Normal heart rate noted RESPIRATORY: Effort and breath sounds normal, no problems with respiration noted ABDOMEN: Soft, no distention noted.   PELVIC:  Deferred MUSCULOSKELETAL: Normal range of motion. No edema and no tenderness.  Labs and Imaging US Transvaginal Non-ob  07/02/2014   CLINICAL DATA:  Abnormal uterine bleeding  EXAM: TRANSABDOMINAL AND TRANSVAGINAL ULTRASOUND OF PELVIS  TECHNIQUE: Both transabdominal and transvaginal ultrasound examinations of the pelvis were performed. Transabdominal technique was performed for global imaging of the pelvis including uterus, ovaries, adnexal regions, and pelvic cul-de-sac. It was necessary to proceed with endovaginal exam following the transabdominal exam to visualize the ovaries.  COMPARISON:  CT 05/02/2014  FINDINGS: Uterus  Measurements: 7.3 by 3.6 by 7.9 cm. Multiple fibroids. The largest is partially calcified within the right side of the fundus measuring 2.7 x 2.8 x 2.8 cm.  Endometrium  Thickness: 6.4 mm.  No focal abnormality visualized.  Right ovary  Measurements: Not visualize. No adnexal mass noted.  Left ovary  Measurements: 2.3 x 1.2 x 1.5 cm. Normal appearance/no adnexal mass.  Other findings  No free fluid.  IMPRESSION: 1. Normal appearance of the endometrium. If bleeding remains unresponsive to hormonal or medical therapy, sonohysterogram should be considered for focal lesion work-up. (Ref: Radiological Reasoning: Algorithmic Workup of Abnormal Vaginal Bleeding with Endovaginal Sonography and Sonohysterography. AJR 2008; 568:L27-51) 2. Uterine fibroids.   Electronically Signed   By: Kerby Moors M.D.   On: 07/02/2014 15:29   US Pelvis Complete  07/02/2014   CLINICAL DATA:  Abnormal uterine bleeding  EXAM: TRANSABDOMINAL AND TRANSVAGINAL ULTRASOUND OF PELVIS  TECHNIQUE: Both transabdominal and transvaginal ultrasound examinations of the pelvis were performed. Transabdominal technique was performed for global imaging of the pelvis including uterus, ovaries, adnexal regions, and pelvic cul-de-sac. It was necessary to proceed  with endovaginal exam following the transabdominal exam to visualize  the ovaries.  COMPARISON:  CT 05/02/2014  FINDINGS: Uterus  Measurements: 7.3 by 3.6 by 7.9 cm. Multiple fibroids. The largest is partially calcified within the right side of the fundus measuring 2.7 x 2.8 x 2.8 cm.  Endometrium  Thickness: 6.4 mm.  No focal abnormality visualized.  Right ovary  Measurements: Not visualize. No adnexal mass noted.  Left ovary  Measurements: 2.3 x 1.2 x 1.5 cm. Normal appearance/no adnexal mass.  Other findings  No free fluid.  IMPRESSION: 1. Normal appearance of the endometrium. If bleeding remains unresponsive to hormonal or medical therapy, sonohysterogram should be considered for focal lesion work-up. (Ref: Radiological Reasoning: Algorithmic Workup of Abnormal Vaginal Bleeding with Endovaginal Sonography and Sonohysterography. AJR 2008; 003:B04-88) 2. Uterine fibroids.   Electronically Signed   By: Kerby Moors M.D.   On: 07/02/2014 15:29   06/26/2014  Endometrial biopsy  DEGENERATING SECRETORY-TYPE ENDOMETRIUM.  BENIGN ENDOCERVICAL MUCOSA Assessment & Plan:  Discussed management options for abnormal uterine bleeding including tranexamic acid (Lysteda), oral progesterone, Depo Provera, Mirena IUD, endometrial ablation (Novasure/Hydrothermal Ablation) or hysterectomy as definitive surgical management.  Discussed risks and benefits of each method.   Patient desires definitive management with hysterectomy.  I proposed doing a total vaginal hysterectomy (TVH) and possible prophylactic bilateral salpingectomy.  No indication for oophorectomy.   Patient agrees with this proposed surgery.  The risks of surgery were discussed in detail with the patient including but not limited to: bleeding which may require transfusion or reoperation; infection which may require antibiotics; injury to bowel, bladder, ureters or other surrounding organs; need for additional procedures including laparotomy; thromboembolic phenomenon, incisional problems and other postoperative/anesthesia  complications.  Patient was also advised that she will remain in house for 1 night; and expected recovery time after a hysterectomy is 6-8 weeks.  Likelihood of success in alleviating the patient's symptoms was discussed.   She was told that she will be contacted by our surgical scheduler regarding the time and date of her surgery; routine preoperative instructions of having nothing to eat or drink after midnight on the day prior to surgery and also coming to the hospital 1 1/2 hours prior to her time of surgery were also emphasized.  She was told she may be called for a preoperative appointment about a week prior to surgery and will be given further preoperative instructions at that visit.  Routine postoperative instructions will be reviewed with the patient and her family in detail after surgery.  In the meantime, she will continue Megace; bleeding precautions were reviewed. Printed patient education handouts about the procedure was given to the patient to review at home. Routine preventative health maintenance measures emphasized. Please refer to After Visit Summary for other counseling recommendations.    Verita Schneiders, MD, [place] Attending [place] for [employer], [place]

## 2014-07-31 NOTE — Patient Instructions (Signed)
Hysterectomy Information  A hysterectomy is a surgery in which your uterus is removed. This surgery may be done to treat various medical problems. After the surgery, you will no longer have menstrual periods. The surgery will also make you unable to become pregnant (sterile). The fallopian tubes and ovaries can be removed (bilateral salpingo-oophorectomy) during this surgery as well.  REASONS FOR A HYSTERECTOMY  Persistent, abnormal bleeding.  Lasting (chronic) pelvic pain or infection.  The lining of the uterus (endometrium) starts growing outside the uterus (endometriosis).  The endometrium starts growing in the muscle of the uterus (adenomyosis).  The uterus falls down into the vagina (pelvic organ prolapse).  Noncancerous growths in the uterus (uterine fibroids) that cause symptoms.  Precancerous cells.  Cervical cancer or uterine cancer. TYPES OF HYSTERECTOMIES  Supracervical hysterectomy--In this type, the top part of the uterus is removed, but not the cervix.  Total hysterectomy--The uterus and cervix are removed.  Radical hysterectomy--The uterus, the cervix, and the fibrous tissue that holds the uterus in place in the pelvis (parametrium) are removed. WAYS A HYSTERECTOMY CAN BE PERFORMED  Abdominal hysterectomy--A large surgical cut (incision) is made in the abdomen. The uterus is removed through this incision.  Vaginal hysterectomy--An incision is made in the vagina. The uterus is removed through this incision. There are no abdominal incisions.  Conventional laparoscopic hysterectomy--Three or four small incisions are made in the abdomen. A thin, lighted tube with a camera (laparoscope) is inserted into one of the incisions. Other tools are put through the other incisions. The uterus is cut into small pieces. The small pieces are removed through the incisions, or they are removed through the vagina.  Laparoscopically assisted vaginal hysterectomy (LAVH)--Three or four  small incisions are made in the abdomen. Part of the surgery is performed laparoscopically and part vaginally. The uterus is removed through the vagina.  Robot-assisted laparoscopic hysterectomy--A laparoscope and other tools are inserted into 3 or 4 small incisions in the abdomen. A computer-controlled device is used to give the surgeon a 3D image and to help control the surgical instruments. This allows for more precise movements of surgical instruments. The uterus is cut into small pieces and removed through the incisions or removed through the vagina. RISKS AND COMPLICATIONS  Possible complications associated with this procedure include:  Bleeding and risk of blood transfusion. Tell your health care provider if you do not want to receive any blood products.  Blood clots in the legs or lung.  Infection.  Injury to surrounding organs.  Problems or side effects related to anesthesia.  Conversion to an abdominal hysterectomy from one of the other techniques. WHAT TO EXPECT AFTER A HYSTERECTOMY  You will be given pain medicine.  You will need to have someone with you for the first 3-5 days after you go home.  You will need to follow up with your surgeon in 2-4 weeks after surgery to evaluate your progress.  You may have early menopause symptoms such as hot flashes, night sweats, and insomnia.  If you had a hysterectomy for a problem that was not cancer or not a condition that could lead to cancer, then you no longer need Pap tests. However, even if you no longer need a Pap test, a regular exam is a good idea to make sure no other problems are starting. Document Released: 08/04/2000 Document Revised: 11/29/2012 Document Reviewed: 10/16/2012 [facility] Patient Information 2015 [place], [place]. This information is not intended to replace advice given to you by your health care  provider. Make sure you discuss any questions you have with your health care provider.

## 2014-08-06 ENCOUNTER — Encounter: Payer: Self-pay | Admitting: General Practice

## 2014-08-06 ENCOUNTER — Encounter (HOSPITAL_COMMUNITY): Payer: Self-pay | Admitting: *Deleted

## 2014-08-08 ENCOUNTER — Telehealth (HOSPITAL_COMMUNITY): Payer: Self-pay | Admitting: *Deleted

## 2014-08-08 NOTE — Telephone Encounter (Signed)
Left message for Kendra [name] stating we were putting her surgery on hold until she is approved for her Medicaid since there is no way to guarantee Medicaid will retroactively cover this, and we do not want her to end up with a large bill.  She can go through Financial services to see if she qualifies for any assistance or set up payment plans.  Furthermore, Medicaid requires her to sign a hysterectomy statement whick cannot be signed before Medicaid is active.  She is to call me if she has any questions or concerns and to let me know when this is completed.

## 2014-08-13 ENCOUNTER — Inpatient Hospital Stay (HOSPITAL_COMMUNITY): Admission: RE | Admit: 2014-08-13 | Payer: Self-pay | Source: Ambulatory Visit

## 2014-08-21 ENCOUNTER — Ambulatory Visit: Admit: 2014-08-21 | Payer: Self-pay | Admitting: Obstetrics & Gynecology

## 2014-08-21 SURGERY — HYSTERECTOMY, VAGINAL
Anesthesia: Choice | Site: Vagina

## 2014-10-21 ENCOUNTER — Other Ambulatory Visit: Payer: Self-pay | Admitting: Obstetrics & Gynecology

## 2014-12-19 ENCOUNTER — Other Ambulatory Visit: Payer: Self-pay | Admitting: Obstetrics & Gynecology

## 2015-02-12 ENCOUNTER — Other Ambulatory Visit: Payer: Self-pay | Admitting: Obstetrics & Gynecology

## 2015-03-18 MED FILL — NAPROXEN 500 MG TABLET: 500 | 30 days supply | Qty: 60 | Fill #1

## 2015-03-18 MED FILL — MEGESTROL 40 MG TABLET: 40 | 30 days supply | Qty: 60 | Fill #3

## 2015-04-17 MED FILL — NAPROXEN 500 MG TABLET: 500 | 30 days supply | Qty: 60 | Fill #2

## 2015-04-17 MED FILL — MEGESTROL 40 MG TABLET: 40 | 30 days supply | Qty: 60 | Fill #4

## 2015-05-19 MED FILL — MEGESTROL 40 MG TABLET: 40 | 30 days supply | Qty: 60 | Fill #5

## 2015-05-19 MED FILL — NAPROXEN 500 MG TABLET: 500 | 30 days supply | Qty: 60 | Fill #3

## 2015-06-16 MED FILL — MEGESTROL 40 MG TABLET: 40 | 30 days supply | Qty: 60 | Fill #6

## 2015-07-16 ENCOUNTER — Other Ambulatory Visit: Payer: Self-pay | Admitting: Obstetrics & Gynecology

## 2015-07-16 MED FILL — NAPROXEN 500 MG TABLET: 500 | 30 days supply | Qty: 60 | Fill #0

## 2015-07-16 MED FILL — MEGESTROL 40 MG TABLET: 40 | 30 days supply | Qty: 60 | Fill #0

## 2015-08-13 MED FILL — MEGESTROL 40 MG TABLET: 40 | 30 days supply | Qty: 60 | Fill #1

## 2015-08-13 MED FILL — NAPROXEN 500 MG TABLET: 500 | 30 days supply | Qty: 60 | Fill #1

## 2015-09-17 MED FILL — NAPROXEN 500 MG TABLET: 500 | 30 days supply | Qty: 60 | Fill #2

## 2015-09-17 MED FILL — MEGESTROL 40 MG TABLET: 40 | 30 days supply | Qty: 60 | Fill #2

## 2015-10-15 MED FILL — MEGESTROL 40 MG TABLET: 40 | 30 days supply | Qty: 60 | Fill #3

## 2015-10-15 MED FILL — NAPROXEN 500 MG TABLET: 500 | 30 days supply | Qty: 60 | Fill #3

## 2015-11-18 MED FILL — MEGESTROL 40 MG TABLET: 40 | 30 days supply | Qty: 60 | Fill #4

## 2015-11-18 MED FILL — NAPROXEN 500 MG TABLET: 500 | 30 days supply | Qty: 60 | Fill #4

## 2015-12-17 ENCOUNTER — Other Ambulatory Visit: Payer: Self-pay | Admitting: Obstetrics & Gynecology

## 2015-12-17 MED FILL — MEGESTROL 40 MG TABLET: 40 | 30 days supply | Qty: 60 | Fill #5

## 2015-12-17 MED FILL — NAPROXEN 500 MG TABLET: 500 | 30 days supply | Qty: 60 | Fill #0

## 2016-01-19 MED FILL — NAPROXEN 500 MG TABLET: 500 | 30 days supply | Qty: 60 | Fill #1

## 2016-01-19 MED FILL — MEGESTROL 40 MG TABLET: 40 | 30 days supply | Qty: 60 | Fill #6

## 2016-02-18 MED FILL — NAPROXEN 500 MG TABLET: 500 | 30 days supply | Qty: 60 | Fill #2

## 2016-02-18 MED FILL — MEGESTROL 40 MG TABLET: 40 | 30 days supply | Qty: 60 | Fill #7

## 2016-03-15 ENCOUNTER — Other Ambulatory Visit: Payer: Self-pay | Admitting: Obstetrics & Gynecology

## 2016-03-15 MED FILL — MEGESTROL 40 MG TABLET: 40 | 30 days supply | Qty: 60 | Fill #0

## 2016-03-15 MED FILL — NAPROXEN 500 MG TABLET: 500 | 30 days supply | Qty: 60 | Fill #3

## 2016-04-12 MED FILL — NAPROXEN 500 MG TABLET: 500 | 30 days supply | Qty: 60 | Fill #4

## 2016-04-12 MED FILL — MEGESTROL 40 MG TABLET: 40 | 30 days supply | Qty: 60 | Fill #1

## 2016-05-12 MED FILL — MEGESTROL 40 MG TABLET: 40 | 30 days supply | Qty: 60 | Fill #2

## 2016-06-16 ENCOUNTER — Other Ambulatory Visit: Payer: Self-pay | Admitting: Obstetrics & Gynecology

## 2016-06-16 MED FILL — MEGESTROL 40 MG TABLET: 40 | 30 days supply | Qty: 60 | Fill #3

## 2016-06-17 MED FILL — NAPROXEN 500 MG TABLET: 500 | 30 days supply | Qty: 60 | Fill #0

## 2016-07-20 MED FILL — NAPROXEN 500 MG TABLET: 500 | 30 days supply | Qty: 60 | Fill #1

## 2016-07-20 MED FILL — MEGESTROL 40 MG TABLET: 40 | 30 days supply | Qty: 60 | Fill #4

## 2016-08-18 MED FILL — MEGESTROL 40 MG TABLET: 40 | 30 days supply | Qty: 60 | Fill #5

## 2016-09-24 MED FILL — MEGESTROL 40 MG TABLET: 40 | 30 days supply | Qty: 60 | Fill #6

## 2016-10-26 MED FILL — MEGESTROL 40 MG TABLET: 40 | 30 days supply | Qty: 60 | Fill #7

## 2016-11-18 IMAGING — CT CT ABD-PELV W/ CM
1 of 3 series · 13 of 32 positions shown, 18 images · IV contrast (OMNIPAQUE 300)
Comparison: Pelvic ultrasound 07/02/2010.

CLINICAL DATA: 60 lb weight loss over the last 8 months. Nausea
with eating. Palpable knot on the posterior left-sided the abdomen.
History of fibroids, anemia and leukopenia. Initial encounter.

EXAM:
CT ABDOMEN AND PELVIS WITH CONTRAST
TECHNIQUE: Multidetector CT imaging of the abdomen and pelvis was performed
using the standard protocol following bolus administration of
intravenous contrast.
CONTRAST:  100mL OMNIPAQUE IOHEXOL 300 MG/ML  SOLN

[Series 2: abd/pel with · axial · 0.81mm/px · z∈[+995,+1395]mm · 13 of 90 slices shown, 18 images]
[im 5/90  soft-tissue]
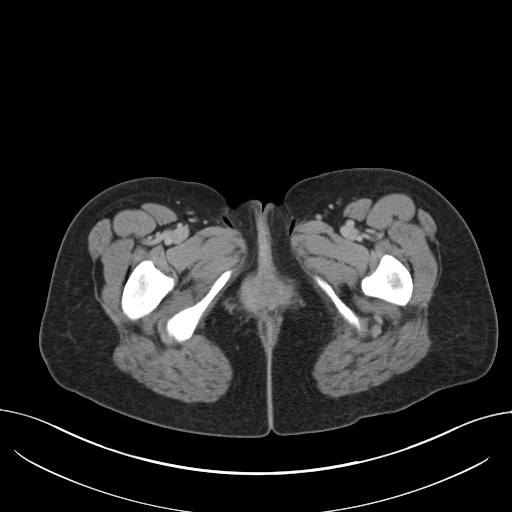
[im 5/90  bone]
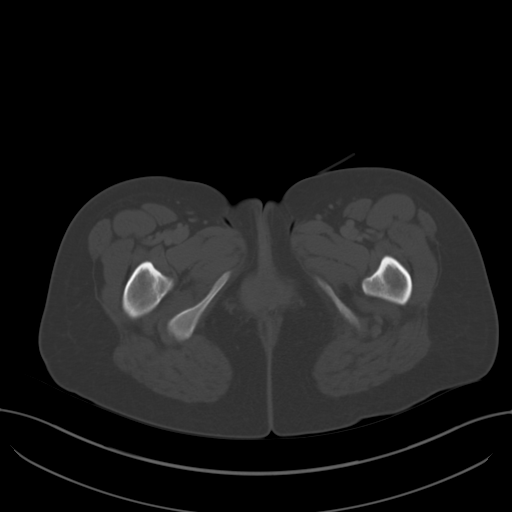
[im 15/90  soft-tissue]
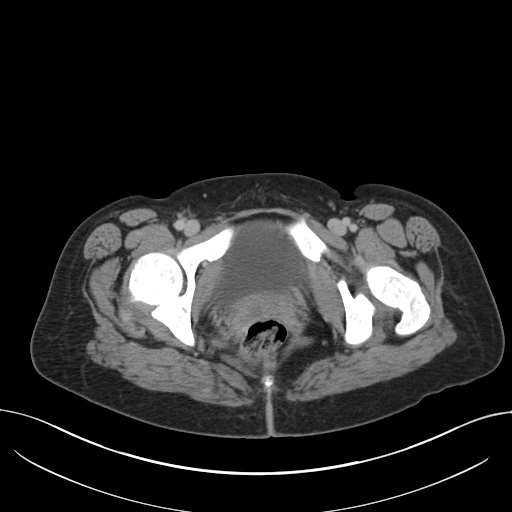
[im 20/90  soft-tissue]
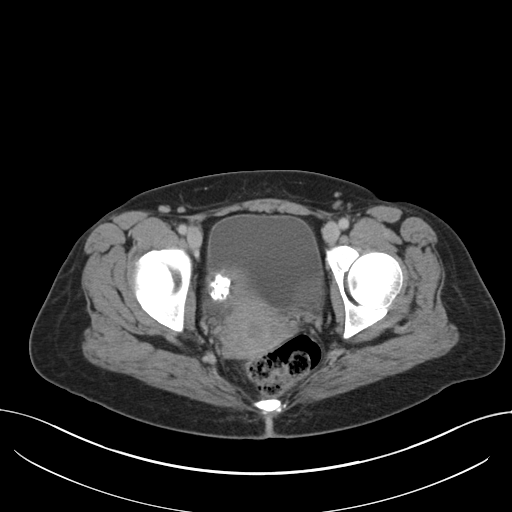
[im 25/90  soft-tissue]
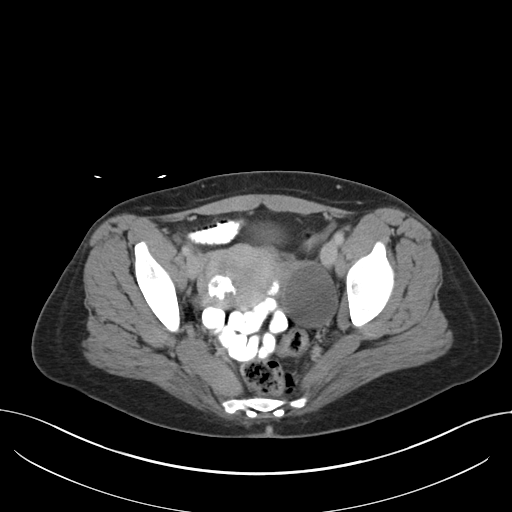
[im 35/90  soft-tissue]
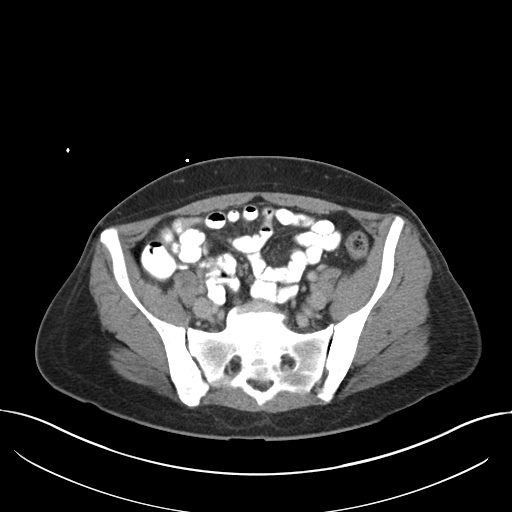
[im 40/90  soft-tissue]
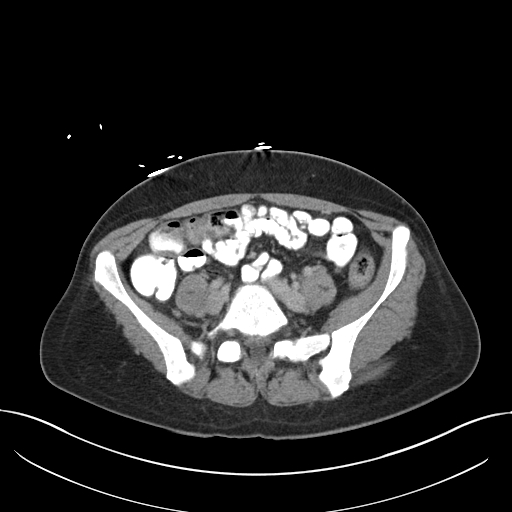
[im 50/90  soft-tissue]
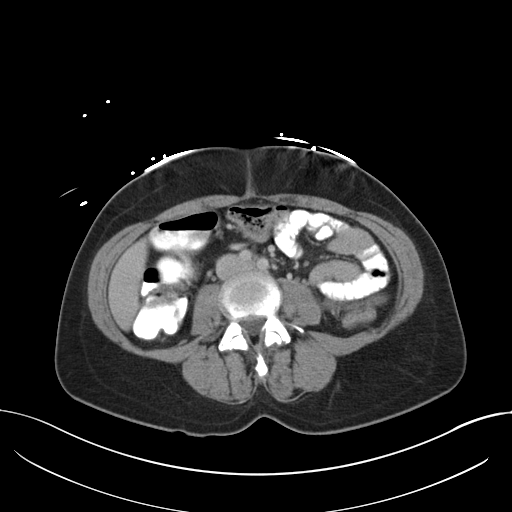
[im 55/90  soft-tissue]
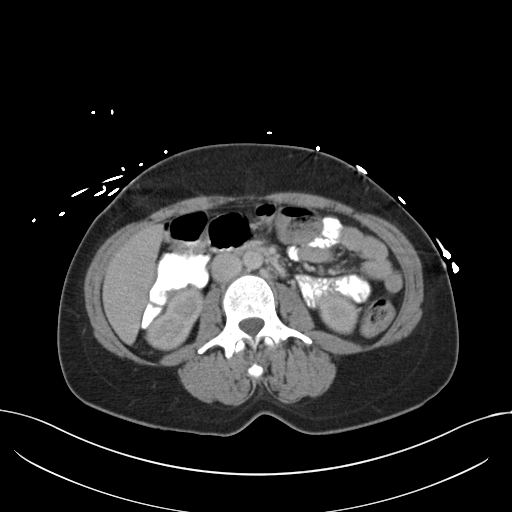
[im 65/90  soft-tissue]
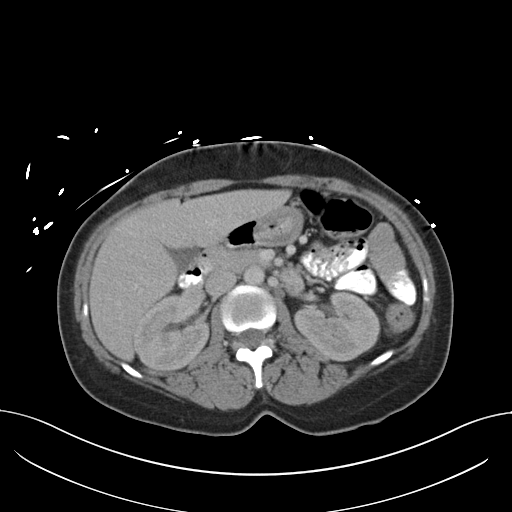
[im 65/90  bone]
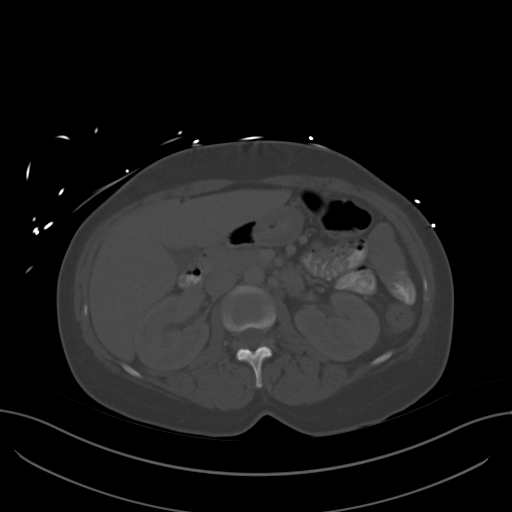
[im 70/90  soft-tissue]
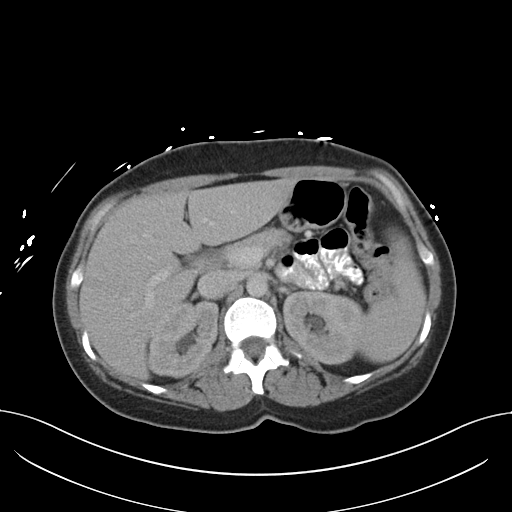
[im 70/90  lung]
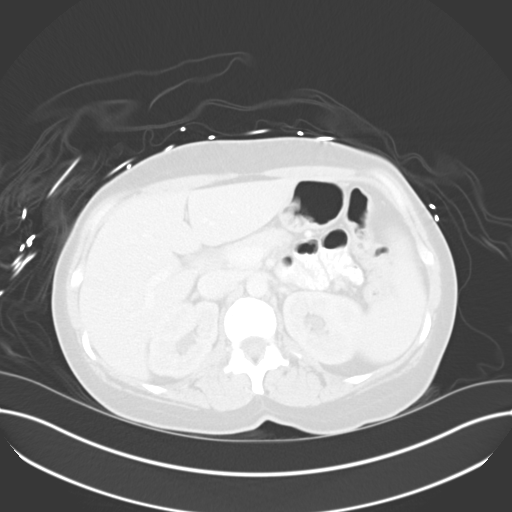
[im 75/90  soft-tissue]
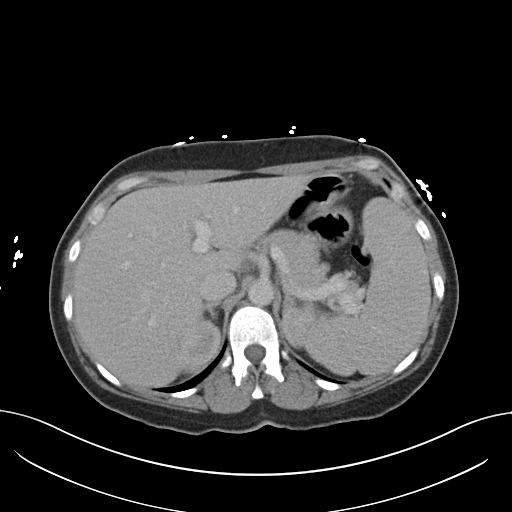
[im 75/90  lung]
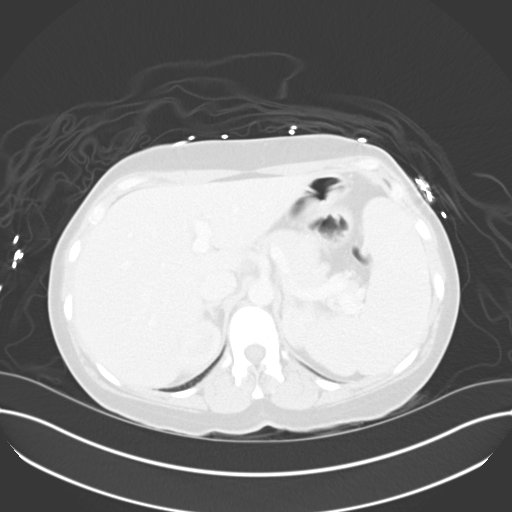
[im 80/90  lung]
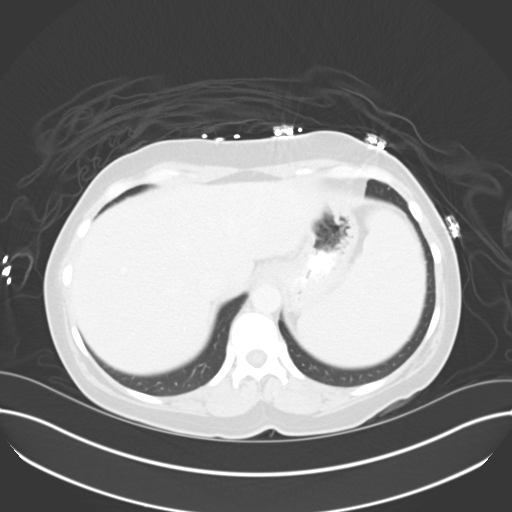
[im 85/90  soft-tissue]
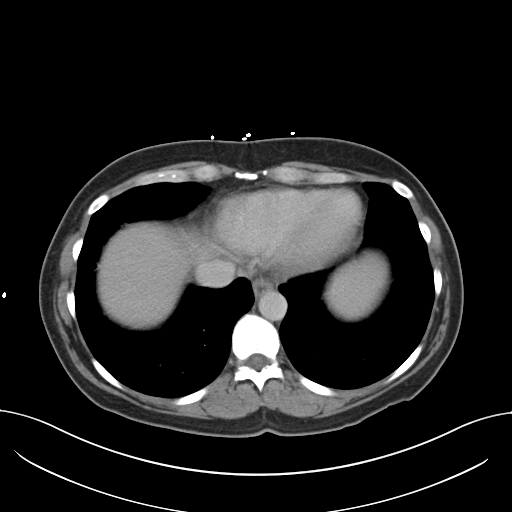
[im 85/90  lung]
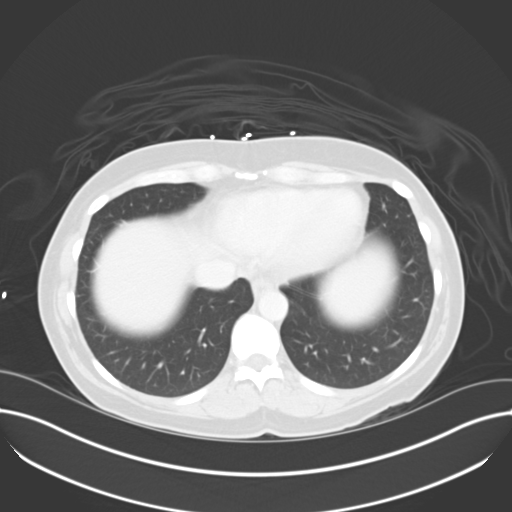

[13 of 32 positions shown; findings below may reference images not displayed]

FINDINGS: Lower chest: Clear lung bases. No significant pleural or pericardial
effusion.

Hepatobiliary: The liver is normal in density without focal
abnormality. No evidence of gallstones, gallbladder wall thickening
or biliary dilatation.

Pancreas: Unremarkable. No pancreatic ductal dilatation or
surrounding inflammatory changes.

Spleen: The spleen is enlarged, measuring 14.6 x 9.1 x 8.5 cm for an
estimated volume of 565 ml. No focal splenic abnormality
demonstrated.

Adrenals/Urinary Tract: Both adrenal glands appear normal.The
kidneys appear normal without evidence of urinary tract calculus,
suspicious lesion or hydronephrosis. No bladder abnormalities are
seen.

Stomach/Bowel: No evidence of bowel wall thickening, distention or
surrounding inflammatory change.

Vascular/Lymphatic: There are no enlarged abdominal or pelvic lymph
nodes. No significant vascular findings are present.

Reproductive: 2.9 cm calcified right uterine fibroid noted. Water
density left adnexal lesion measuring 5.3 x 4.3 cm is most
consistent with a functional cyst of the left ovary. The right ovary
appears normal. There is no suspicious adnexal finding or pelvic
inflammatory process.

Other: No evidence of abdominal wall mass or hernia.

Musculoskeletal: No acute or significant osseous findings.
IMPRESSION: 1. No abdominal wall mass or acute findings demonstrated within the
abdomen or pelvis.
2. Mild nonspecific splenomegaly. This may contribute to the
patient's anemia and leukopenia. Further hematologic assessment may
be warranted if not previously performed.
[DATE] cm left ovarian cyst, likely functional. Because of size
greater than 5 cm, current guidelines call for ultrasound follow up
in 1 year to further evaluate. This recommendation follows ACR
consensus guidelines: White Paper of the ACR Incidental Findings
Committee II on Adnexal Findings. [HOSPITAL] [DATE].

## 2016-11-29 ENCOUNTER — Other Ambulatory Visit: Payer: Self-pay | Admitting: Obstetrics & Gynecology

## 2016-11-30 MED FILL — MEGESTROL 40 MG TABLET: 40 | 30 days supply | Qty: 60 | Fill #0

## 2017-01-03 MED FILL — MEGESTROL 40 MG TABLET: 40 | 30 days supply | Qty: 60 | Fill #1

## 2017-01-18 IMAGING — US US PELVIS COMPLETE
1 series · 13 of 25 positions shown · non-contrast
Comparison: CT 05/02/2014

CLINICAL DATA: Abnormal uterine bleeding



[Series 1: us pelvis complete · 13 of 67 slices shown]
[im 1/67]
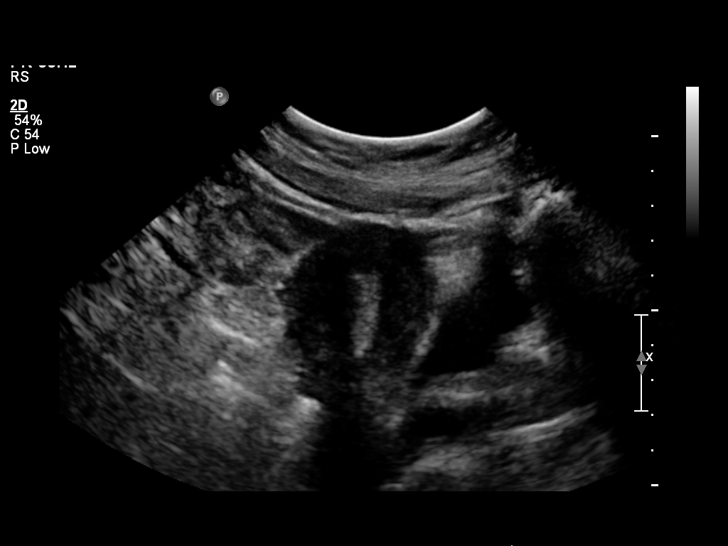
[im 6/67]
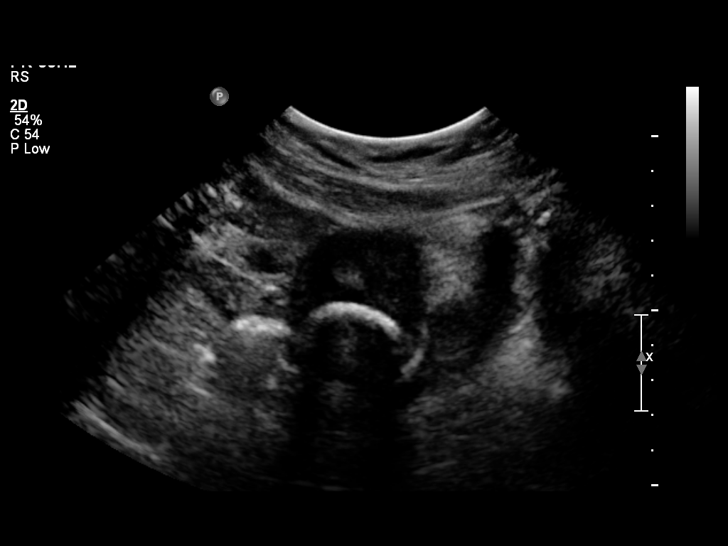
[im 12/67]
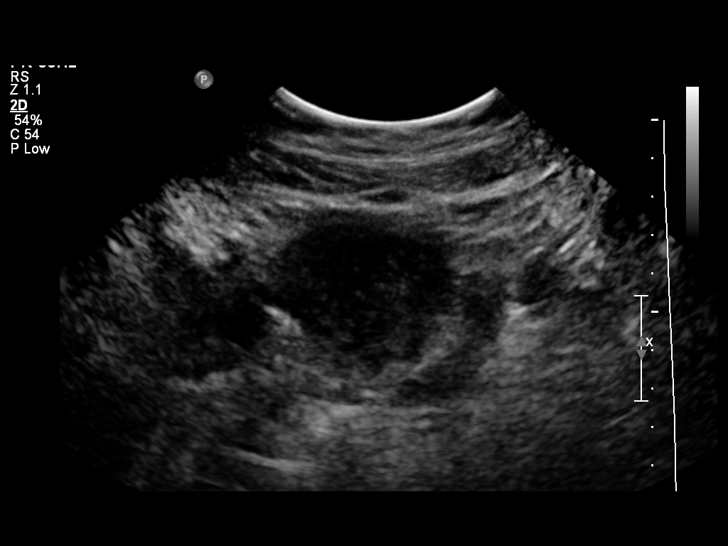
[im 17/67]
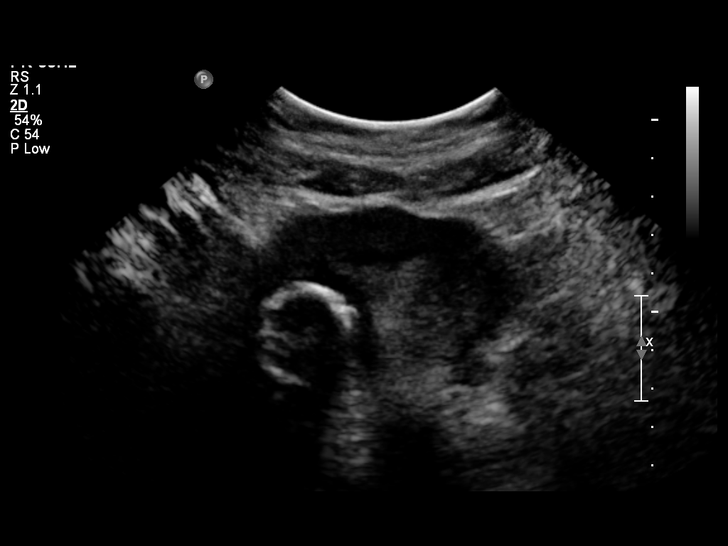
[im 23/67]
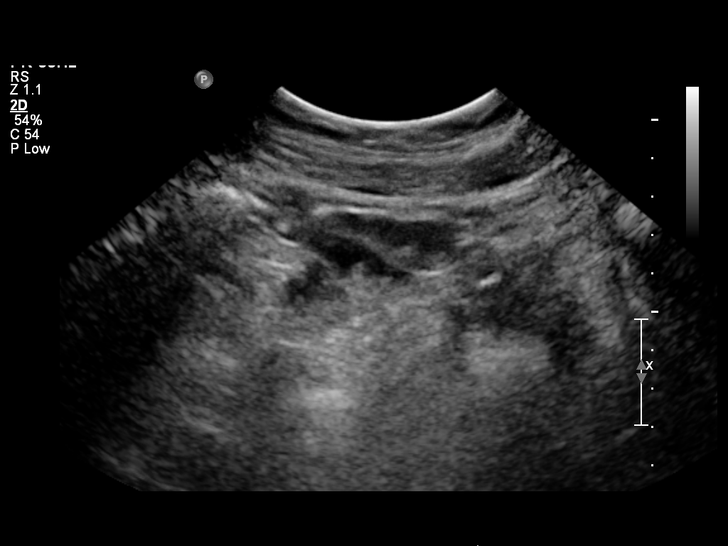
[im 28/67]
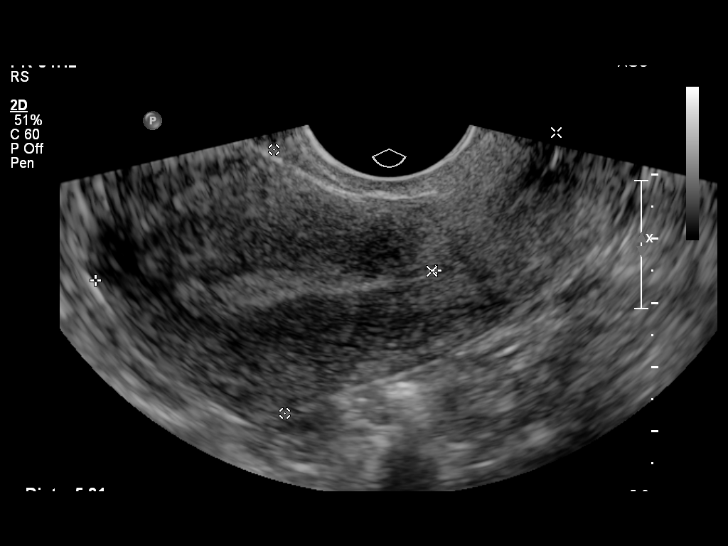
[im 34/67]
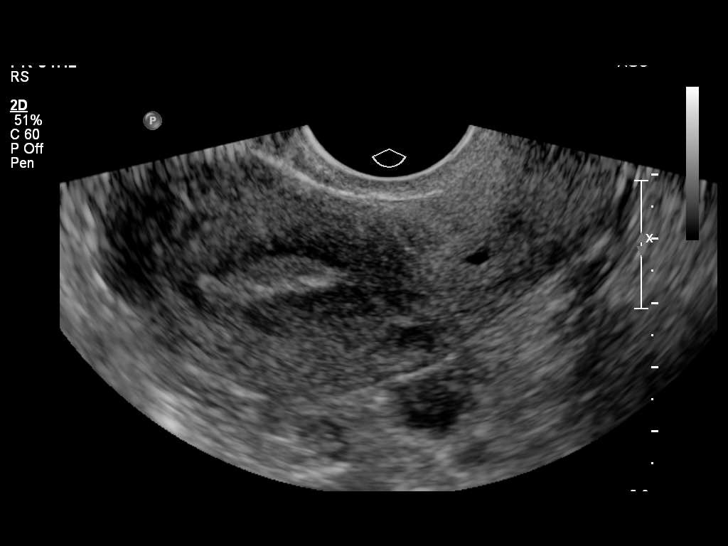
[im 39/67]
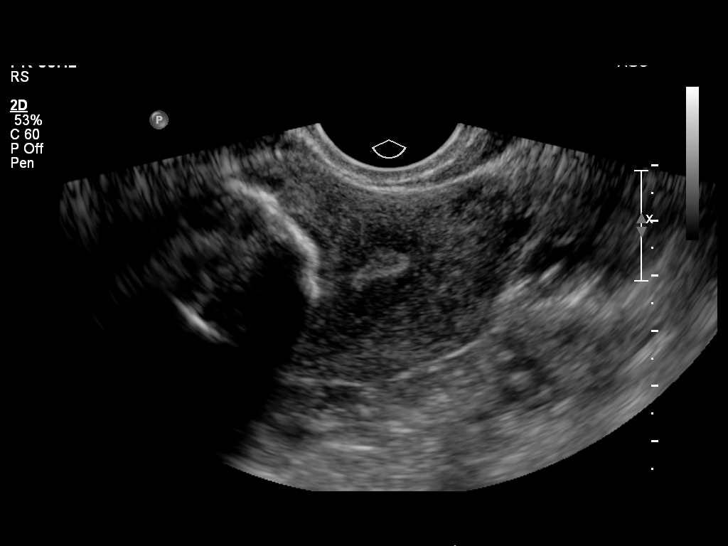
[im 45/67]
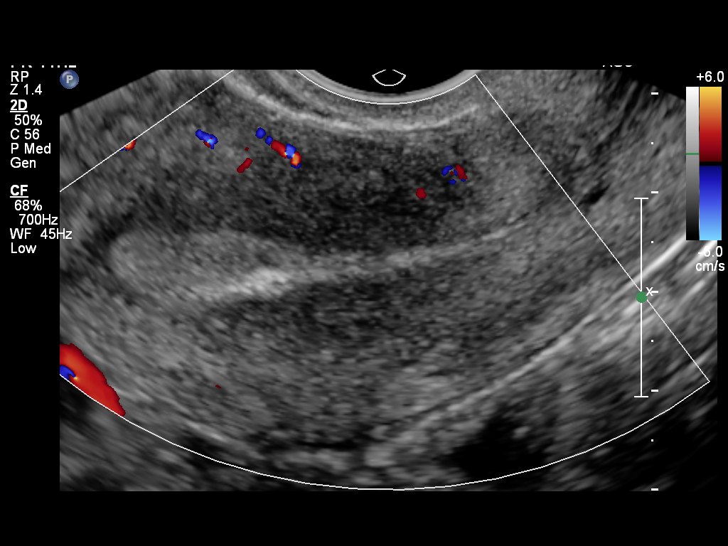
[im 50/67]
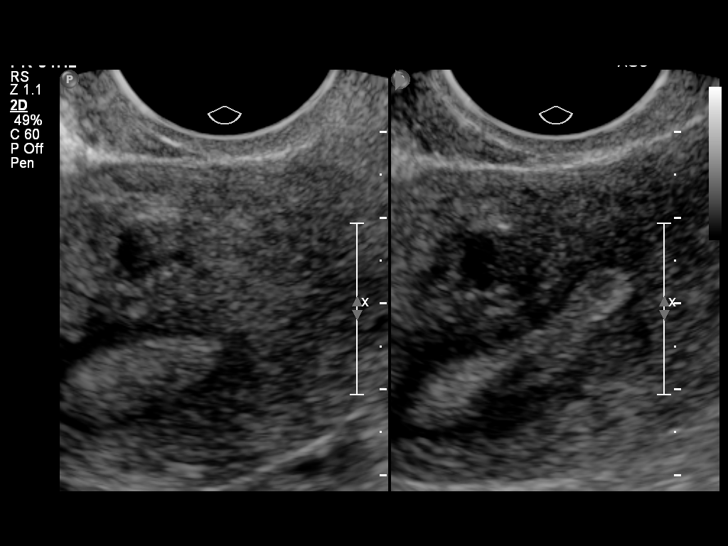
[im 56/67]
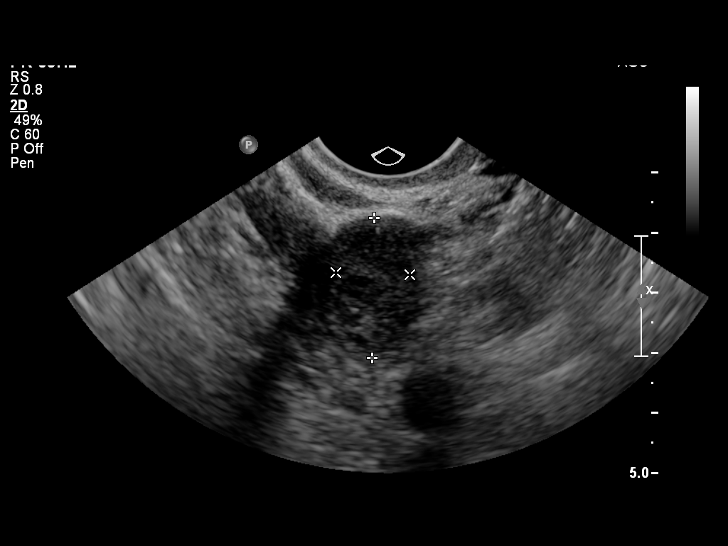
[im 61/67]
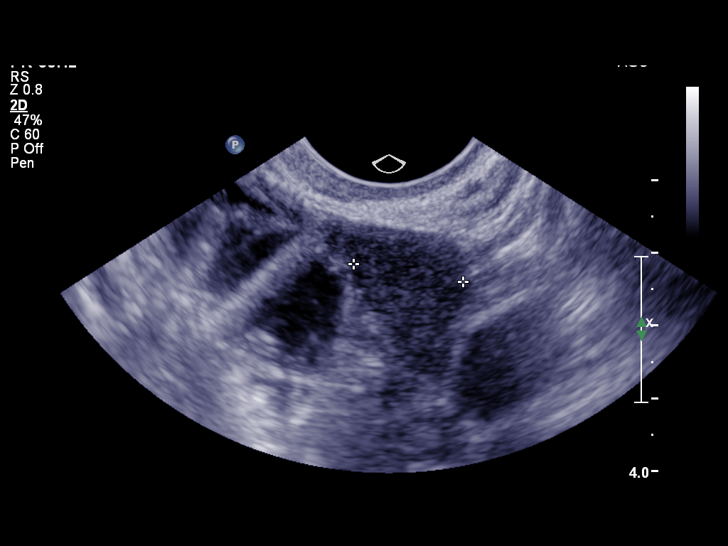
[im 67/67]
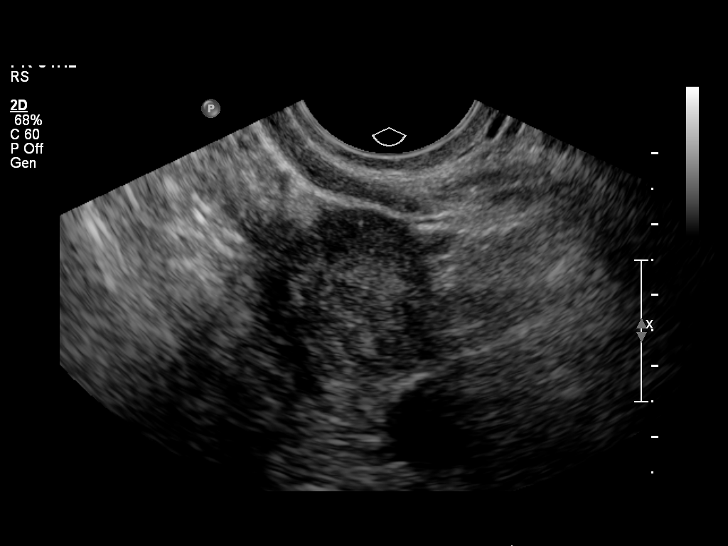

[13 of 25 positions shown; findings below may reference images not displayed]

FINDINGS: Uterus

Measurements: 7.3 by 3.6 by 7.9 cm.. Multiple fibroids. The largest
is partially calcified within the right side of the fundus measuring
2.7 x 2.8 x 2.8 cm.

Endometrium

Thickness: 6.4 mm..  No focal abnormality visualized.

Right ovary

Measurements: Not visualize.. No adnexal mass noted.

Left ovary

Measurements: 2.3 x 1.2 x 1.5 cm. Normal appearance/no adnexal mass.

Other findings

No free fluid.
IMPRESSION: 1. Normal appearance of the endometrium. If bleeding remains
unresponsive to hormonal or medical therapy, sonohysterogram should
be considered for focal lesion work-up. (Ref: Radiological
Reasoning: Algorithmic Workup of Abnormal Vaginal Bleeding with
Endovaginal Sonography and Sonohysterography. AJR 2880; 191:S68-73)
2. Uterine fibroids.

## 2017-02-08 MED FILL — MEGESTROL 40 MG TABLET: 40 | 30 days supply | Qty: 60 | Fill #2

## 2017-03-14 MED FILL — MEGESTROL 40 MG TABLET: 40 | 30 days supply | Qty: 60 | Fill #3

## 2017-04-19 MED FILL — MEGESTROL 40 MG TABLET: 40 | 30 days supply | Qty: 60 | Fill #4

## 2017-05-17 MED FILL — MEGESTROL 40 MG TABLET: 40 | 30 days supply | Qty: 60 | Fill #5

## 2017-05-17 MED FILL — NAPROXEN 500 MG TABLET: 500 | 30 days supply | Qty: 60 | Fill #2

## 2017-06-28 MED FILL — MEGESTROL 40 MG TABLET: 40 | 7 days supply | Qty: 60 | Fill #6

## 2017-08-02 MED FILL — MEGESTROL 40 MG TABLET: 40 | 15 days supply | Qty: 60 | Fill #7

## 2017-09-08 ENCOUNTER — Other Ambulatory Visit: Payer: Self-pay | Admitting: Obstetrics & Gynecology

## 2017-09-08 MED FILL — MEGESTROL 40 MG TABLET: 40 | 15 days supply | Qty: 60 | Fill #0

## 2017-09-15 ENCOUNTER — Other Ambulatory Visit: Payer: Self-pay | Admitting: Obstetrics & Gynecology

## 2017-10-11 MED FILL — MEGESTROL 40 MG TABLET: 40 | 15 days supply | Qty: 60 | Fill #1

## 2017-11-22 MED FILL — MEGESTROL 40 MG TABLET: 40 | 15 days supply | Qty: 60 | Fill #2

## 2017-12-26 MED FILL — MEGESTROL 40 MG TABLET: 40 | 15 days supply | Qty: 60 | Fill #3

## 2018-01-30 MED FILL — MEGESTROL 40 MG TABLET: 40 | 15 days supply | Qty: 60 | Fill #4

## 2018-03-14 MED FILL — MEGESTROL 40 MG TABLET: 40 | 15 days supply | Qty: 60 | Fill #5

## 2018-04-17 MED FILL — MEGESTROL 40 MG TABLET: 40 | 15 days supply | Qty: 60 | Fill #6

## 2018-05-15 MED FILL — MEGESTROL 40 MG TABLET: 40 | 15 days supply | Qty: 60 | Fill #7

## 2018-06-15 ENCOUNTER — Other Ambulatory Visit: Payer: Self-pay | Admitting: Obstetrics & Gynecology

## 2018-06-21 MED FILL — MEGESTROL 40 MG TABLET: 40 | 15 days supply | Qty: 60 | Fill #0

## 2018-07-25 MED FILL — MEGESTROL 40 MG TABLET: 40 | 15 days supply | Qty: 60 | Fill #1

## 2018-08-23 ENCOUNTER — Other Ambulatory Visit: Payer: Self-pay

## 2018-08-23 ENCOUNTER — Encounter: Payer: Self-pay | Admitting: Nurse Practitioner

## 2018-08-23 ENCOUNTER — Ambulatory Visit: Payer: Self-pay | Attending: Nurse Practitioner | Admitting: Nurse Practitioner

## 2018-08-23 DIAGNOSIS — N939 Abnormal uterine and vaginal bleeding, unspecified: Secondary | ICD-10-CM

## 2018-08-23 DIAGNOSIS — F17200 Nicotine dependence, unspecified, uncomplicated: Secondary | ICD-10-CM

## 2018-08-23 DIAGNOSIS — Z20822 Contact with and (suspected) exposure to covid-19: Secondary | ICD-10-CM

## 2018-08-23 DIAGNOSIS — F330 Major depressive disorder, recurrent, mild: Secondary | ICD-10-CM

## 2018-08-23 MED ORDER — ESCITALOPRAM OXALATE 10 MG PO TABS: ORAL_TABLET | ORAL | 1 refills | Status: DC

## 2018-08-23 MED FILL — ESCITALOPRAM 10 MG TABLET: 10 | 30 days supply | Qty: 60 | Fill #0

## 2018-08-23 NOTE — Progress Notes (Signed)
Virtual Visit via Telephone Note Due to national recommendations of social distancing due to [place] 19, telehealth visit is felt to be most appropriate for this patient at this time.  I discussed the limitations, risks, security and privacy concerns of performing an evaluation and management service by telephone and the availability of in person appointments. I also discussed with the patient that there may be a patient responsible charge related to this service. The patient expressed understanding and agreed to proceed.    I connected with Linus Galas on 08/23/18  at   1:50 PM EDT  EDT by telephone and verified that I am speaking with the correct person using two identifiers.   Consent I discussed the limitations, risks, security and privacy concerns of performing an evaluation and management service by telephone and the availability of in person appointments. I also discussed with the patient that there may be a patient responsible charge related to this service. The patient expressed understanding and agreed to proceed.   Location of Patient: Private Residence   Location of Provider: [place] and [place] participating in Telemedicine visit: Geryl Rankins FNP-BC [place]    History of Present Illness: Telemedicine visit for: Establish care She takes megace for AUB. Being prescribed by OB/GYN. Last PAP 2019. She is overdue. Last mammogram: NONE. Will refer to [facility] She is very tearful today. PHQ score low. Lost husband 2 years ago 3 days after he had been diagnosed with cancer. She is the only one working in her household. Has a 81 year old son who is unemployed and her grandson also lives in the home. Endorses increased stress. Open to trying SSRI.  Depression screen [facility] 2/9 08/23/2018  Decreased Interest 0  Down, Depressed, Hopeless 1  PHQ - 2 Score 1  Altered sleeping 1  Tired, decreased energy 1  Change in appetite 0  Feeling bad  or failure about yourself  0  Trouble concentrating 0  Moving slowly or fidgety/restless 0  Suicidal thoughts 0  PHQ-9 Score 3   GAD 7 : Generalized Anxiety Score 08/23/2018  Nervous, Anxious, on Edge 1  Control/stop worrying 2  Worry too much - different things 2  Trouble relaxing 1  Restless 0  Easily annoyed or irritable 3  Afraid - awful might happen 0  Total GAD 7 Score 9     COVID LIKE SYMPTOMS Currently awaiting [facility] COVID Testing results. Bloodwork drawn today. She states she will need a work note.  Her last day of work was Friday 08-21-2018. She denies any known exposure at her job or home. Current symptoms: Headaches, productive cough, "feels warm" but does not have a thermometer. Initially with diarrhea but states that has currently resolved.   Past Medical History:  Diagnosis Date  . Anemia   . Fibroid, uterine     Past Surgical History:  Procedure Laterality Date  . DILATION AND CURETTAGE OF UTERUS  2004    Family History  Problem Relation Age of Onset  . Hypertension Mother   . Heart disease Father   . Cancer Maternal Grandfather     Social History   Socioeconomic History  . Marital status: Married    Spouse name: Not on file  . Number of children: Not on file  . Years of education: Not on file  . Highest education level: Not on file  Occupational History  . Not on file  Social Needs  . Emergency planning/management officer  strain: Not on file  . Food insecurity    Worry: Not on file    Inability: Not on file  . Transportation needs    Medical: Not on file    Non-medical: Not on file  Tobacco Use  . Smoking status: Current Every Day Smoker    Packs/day: 1.00    Years: 30.00    Pack years: 30.00    Types: Cigarettes  . Smokeless tobacco: Never Used  Substance and Sexual Activity  . Alcohol use: No    Alcohol/week: 0.0 standard drinks  . Drug use: No  . Sexual activity: Not Currently  Lifestyle  . Physical activity    Days per week: Not on file    Minutes  per session: Not on file  . Stress: Not on file  Relationships  . Social Herbalist on phone: Not on file    Gets together: Not on file    Attends religious service: Not on file    Active member of club or organization: Not on file    Attends meetings of clubs or organizations: Not on file    Relationship status: Not on file  Other Topics Concern  . Not on file  Social History Narrative  . Not on file     Observations/Objective: Awake, alert and oriented x 3   Review of Systems  Constitutional: Positive for fever. Negative for malaise/fatigue and weight loss.  HENT: Negative.  Negative for nosebleeds.   Eyes: Negative.  Negative for blurred vision, double vision and photophobia.  Respiratory: Positive for cough. Negative for shortness of breath.   Cardiovascular: Negative.  Negative for chest pain, palpitations and leg swelling.  Gastrointestinal: Negative.  Negative for heartburn, nausea and vomiting.  Musculoskeletal: Negative.  Negative for myalgias.  Neurological: Positive for headaches. Negative for dizziness, focal weakness and seizures.  Psychiatric/Behavioral: Positive for depression. Negative for suicidal ideas. The patient is nervous/anxious.     Assessment and Plan: Carlette was seen today for establish care.  Diagnoses and all orders for this visit:  Mild episode of recurrent major depressive disorder ([place]) -     escitalopram (LEXAPRO) 10 MG tablet; Take 1/2 (one half) tablet by mouth for 2 weeks. If needed can increase to 1 (one) tablet in 2 weeks. Please mail.  Abnormal uterine bleeding (AUB) Continue megace as prescribed by GYN   Follow Up Instructions Return in about 6 weeks (around 10/04/2018).     I discussed the assessment and treatment plan with the patient. The patient was provided an opportunity to ask questions and all were answered. The patient agreed with the plan and demonstrated an understanding of the instructions.   The patient was  advised to call back or seek an in-person evaluation if the symptoms worsen or if the condition fails to improve as anticipated.  I provided 28 minutes of non-face-to-face time during this encounter including median intraservice time, reviewing previous notes, labs, imaging, medications and explaining diagnosis and management.  Gildardo Pounds, FNP-BC

## 2018-08-28 LAB — NOVEL CORONAVIRUS, NAA: SARS-CoV-2, NAA: NOT DETECTED

## 2018-08-29 ENCOUNTER — Telehealth (HOSPITAL_COMMUNITY): Payer: Self-pay

## 2018-08-29 NOTE — Telephone Encounter (Signed)
Telephoned patient at home number. Left message with info to call [facility] and schedule appointment.

## 2018-09-11 MED FILL — MEGESTROL 40 MG TABLET: 40 | 15 days supply | Qty: 60 | Fill #2

## 2018-09-11 MED FILL — ESCITALOPRAM 10 MG TABLET: 10 | 37 days supply | Qty: 30 | Fill #0

## 2018-10-09 MED FILL — MEGESTROL 40 MG TABLET: 40 | 15 days supply | Qty: 60 | Fill #3

## 2018-11-15 MED FILL — MEGESTROL 40 MG TABLET: 40 | 15 days supply | Qty: 60 | Fill #4

## 2018-12-11 MED FILL — MEGESTROL 40 MG TABLET: 40 | 15 days supply | Qty: 60 | Fill #5

## 2019-01-16 ENCOUNTER — Other Ambulatory Visit: Payer: Self-pay | Admitting: *Deleted

## 2019-01-16 DIAGNOSIS — Z20822 Contact with and (suspected) exposure to covid-19: Secondary | ICD-10-CM

## 2019-01-17 LAB — NOVEL CORONAVIRUS, NAA: SARS-CoV-2, NAA: NOT DETECTED

## 2019-01-23 MED FILL — MEGESTROL 40 MG TABLET: 40 | 15 days supply | Qty: 60 | Fill #6

## 2019-01-24 ENCOUNTER — Telehealth: Payer: Self-pay | Admitting: Nurse Practitioner

## 2019-01-24 NOTE — Telephone Encounter (Signed)
Negative COVID results given. Patient results "NOT Detected." Caller expressed understanding. ° °

## 2019-02-19 MED FILL — MEGESTROL 40 MG TABLET: 40 | 15 days supply | Qty: 60 | Fill #7

## 2019-03-07 ENCOUNTER — Ambulatory Visit: Payer: Self-pay

## 2019-03-07 NOTE — Progress Notes (Deleted)
Virtual Visit via Telephone Note  I connected with Kendra [name] on 03/07/19 at  9:30 AM EST by telephone and verified that I am speaking with the correct person using two identifiers.   I discussed the limitations, risks, security and privacy concerns of performing an evaluation and management service by telephone and the availability of in person appointments. I also discussed with the patient that there may be a patient responsible charge related to this service. The patient expressed understanding and agreed to proceed.  PATIENT visit by telephone virtually in the context of Covid-19 pandemic. Patient location:  home My Location:  Home/remote office Persons on the call:  Me and the patient    History of Present Illness:    Observations/Objective:   Assessment and Plan:   Follow Up Instructions:    I discussed the assessment and treatment plan with the patient. The patient was provided an opportunity to ask questions and all were answered. The patient agreed with the plan and demonstrated an understanding of the instructions.   The patient was advised to call back or seek an in-person evaluation if the symptoms worsen or if the condition fails to improve as anticipated.  I provided *** minutes of non-face-to-face time during this encounter.   Freeman Caldron, PA-C  Patient ID: Kendra [name], female   DOB: [birth year], 49 y.o.   MRN: [id number]

## 2019-03-08 ENCOUNTER — Other Ambulatory Visit: Payer: Self-pay

## 2019-03-08 ENCOUNTER — Ambulatory Visit: Payer: Self-pay | Attending: Family Medicine | Admitting: Family Medicine

## 2019-03-08 ENCOUNTER — Encounter: Payer: Self-pay | Admitting: Family Medicine

## 2019-03-08 VITALS — BP 156/93 | HR 77 | Temp 99.2°F | Ht 71.0 in | Wt 149.0 lb

## 2019-03-08 DIAGNOSIS — F419 Anxiety disorder, unspecified: Secondary | ICD-10-CM

## 2019-03-08 DIAGNOSIS — L723 Sebaceous cyst: Secondary | ICD-10-CM

## 2019-03-08 MED ORDER — AMOXICILLIN-POT CLAVULANATE 875-125 MG PO TABS: 1.0000 | ORAL_TABLET | Freq: Two times a day (BID) | ORAL | 0 refills | Status: DC

## 2019-03-08 MED ORDER — ACETAMINOPHEN-CODEINE #3 300-30 MG PO TABS: 1.0000 | ORAL_TABLET | ORAL | 0 refills | Status: AC | PRN

## 2019-03-08 MED ORDER — SERTRALINE HCL 50 MG PO TABS: 50.0000 mg | ORAL_TABLET | Freq: Every day | ORAL | 1 refills | Status: DC

## 2019-03-08 MED FILL — AMOX-CLAV 875-125 MG TABLET: 875-125 | 10 days supply | Qty: 20 | Fill #0

## 2019-03-08 MED FILL — SERTRALINE HCL 50 MG TABS: 50 | 30 days supply | Qty: 30 | Fill #0

## 2019-03-08 MED FILL — ACETAMINOPHEN/COD #3 TABLET: 300-30 | 5 days supply | Qty: 45 | Fill #0

## 2019-03-08 NOTE — Patient Instructions (Addendum)
Please take 1/2 pill of the sertraline for the first 6 nights then increase to one whole pill at bedtime and schedule follow-up in 6 weeks   Managing Anxiety, Adult After being diagnosed with an anxiety disorder, you may be relieved to know why you have felt or behaved a certain way. You may also feel overwhelmed about the treatment ahead and what it will mean for your life. With care and support, you can manage this condition and recover from it. How to manage lifestyle changes Managing stress and anxiety  Stress is your body's reaction to life changes and events, both good and bad. Most stress will last just a few hours, but stress can be ongoing and can lead to more than just stress. Although stress can play a major role in anxiety, it is not the same as anxiety. Stress is usually caused by something external, such as a deadline, test, or competition. Stress normally passes after the triggering event has ended.  Anxiety is caused by something internal, such as imagining a terrible outcome or worrying that something will go wrong that will devastate you. Anxiety often does not go away even after the triggering event is over, and it can become long-term (chronic) worry. It is important to understand the differences between stress and anxiety and to manage your stress effectively so that it does not lead to an anxious response. Talk with your health care provider or a counselor to learn more about reducing anxiety and stress. He or she may suggest tension reduction techniques, such as:  Music therapy. This can include creating or listening to music that you enjoy and that inspires you.  Mindfulness-based meditation. This involves being aware of your normal breaths while not trying to control your breathing. It can be done while sitting or walking.  Centering prayer. This involves focusing on a word, phrase, or sacred image that means something to you and brings you peace.  Deep breathing. To do  this, expand your stomach and inhale slowly through your nose. Hold your breath for 3-5 seconds. Then exhale slowly, letting your stomach muscles relax.  Self-talk. This involves identifying thought patterns that lead to anxiety reactions and changing those patterns.  Muscle relaxation. This involves tensing muscles and then relaxing them. Choose a tension reduction technique that suits your lifestyle and personality. These techniques take time and practice. Set aside 5-15 minutes a day to do them. Therapists can offer counseling and training in these techniques. The training to help with anxiety may be covered by some insurance plans. Other things you can do to manage stress and anxiety include:  Keeping a stress/anxiety diary. This can help you learn what triggers your reaction and then learn ways to manage your response.  Thinking about how you react to certain situations. You may not be able to control everything, but you can control your response.  Making time for activities that help you relax and not feeling guilty about spending your time in this way.  Visual imagery and yoga can help you stay calm and relax.  Medicines Medicines can help ease symptoms. Medicines for anxiety include:  Anti-anxiety drugs.  Antidepressants. Medicines are often used as a primary treatment for anxiety disorder. Medicines will be prescribed by a health care provider. When used together, medicines, psychotherapy, and tension reduction techniques may be the most effective treatment. Relationships Relationships can play a big part in helping you recover. Try to spend more time connecting with trusted friends and family members. Consider going  to couples counseling, taking family education classes, or going to family therapy. Therapy can help you and others better understand your condition. How to recognize changes in your anxiety Everyone responds differently to treatment for anxiety. Recovery from anxiety  happens when symptoms decrease and stop interfering with your daily activities at home or work. This may mean that you will start to:  Have better concentration and focus. Worry will interfere less in your daily thinking.  Sleep better.  Be less irritable.  Have more energy.  Have improved memory. It is important to recognize when your condition is getting worse. Contact your health care provider if your symptoms interfere with home or work and you feel like your condition is not improving. Follow these instructions at home: Activity  Exercise. Most adults should do the following: ? Exercise for at least 150 minutes each week. The exercise should increase your heart rate and make you sweat (moderate-intensity exercise). ? Strengthening exercises at least twice a week.  Get the right amount and quality of sleep. Most adults need 7-9 hours of sleep each night. Lifestyle   Eat a healthy diet that includes plenty of vegetables, fruits, whole grains, low-fat dairy products, and lean protein. Do not eat a lot of foods that are high in solid fats, added sugars, or salt.  Make choices that simplify your life.  Do not use any products that contain nicotine or tobacco, such as cigarettes, e-cigarettes, and chewing tobacco. If you need help quitting, ask your health care provider.  Avoid caffeine, alcohol, and certain over-the-counter cold medicines. These may make you feel worse. Ask your pharmacist which medicines to avoid. General instructions  Take over-the-counter and prescription medicines only as told by your health care provider.  Keep all follow-up visits as told by your health care provider. This is important. Where to find support You can get help and support from these sources:  Self-help groups.  Online and [employer].  A trusted spiritual leader.  Couples counseling.  Family education classes.  Family therapy. Where to find more information You may  find that joining a support group helps you deal with your anxiety. The following sources can help you locate counselors or support groups near you:  [place]: www.mentalhealth[place].net  Anxiety and Depression Association of [place] (ADAA): https://www.clark.net/  National Alliance on Mental Illness (NAMI): www.nami.org Contact a health care provider if you:  Have a hard time staying focused or finishing daily tasks.  Spend many hours a day feeling worried about everyday life.  Become exhausted by worry.  Start to have headaches, feel tense, or have nausea.  Urinate more than normal.  Have diarrhea. Get help right away if you have:  A racing heart and shortness of breath.  Thoughts of hurting yourself or others. If you ever feel like you may hurt yourself or others, or have thoughts about taking your own life, get help right away. You can go to your nearest emergency department or call:  Your local emergency services (911 in the [place]  A suicide crisis helpline, such as the [place] at 220-746-4643. This is open 24 hours a day. Summary  Taking steps to learn and use tension reduction techniques can help calm you and help prevent triggering an anxiety reaction.  When used together, medicines, psychotherapy, and tension reduction techniques may be the most effective treatment.  Family, friends, and partners can play a big part in helping you recover from an anxiety disorder. This information is  not intended to replace advice given to you by your health care provider. Make sure you discuss any questions you have with your health care provider. Document Revised: 07/11/2018 Document Reviewed: 07/11/2018 Elsevier Patient Education  [place].

## 2019-03-08 NOTE — Progress Notes (Signed)
Established Patient Office Visit  Subjective:  Patient ID: Kendra [name], female    DOB: 1970/02/27  Age: 49 y.o. MRN: [id number]  CC:  Chief Complaint  Patient presents with  . Cyst    HPI Kendra [name] , 49 year old female, who presents secondary to the complaint of recurrence of a cyst/nodule on her neck.  She reports that she has had this nodule removed twice in the past with her prior primary care provider but the area continues to return.  She reports that the area has enlarged over the past week and is now painful. Pain ranges from about a 6-8 on a 0-10 scale. Sometimes a dull ache and sometimes throbbing.   She has had some drainage of a yellowish, thick, bad smelling substance from the area.  She denies any fever or chills, no difficulty swallowing or sore throat.  She does not have any history of diabetes.          She reports over the current COVID-19 pandemic as well as financial issues due to the pandemic, that she constantly worries. She reports that she has difficulty falling asleep and staying asleep secondary to worrying. She wonders if there is any medication that can help. She denies any suicidal thoughts or ideations.  Past Medical History:  Diagnosis Date  . Anemia   . Fibroid, uterine     Past Surgical History:  Procedure Laterality Date  . DILATION AND CURETTAGE OF UTERUS  2004    Family History  Problem Relation Age of Onset  . Hypertension Mother   . Heart disease Father   . Cancer Maternal Grandfather     Social History   Socioeconomic History  . Marital status: Married    Spouse name: Not on file  . Number of children: Not on file  . Years of education: Not on file  . Highest education level: Not on file  Occupational History  . Not on file  Tobacco Use  . Smoking status: Current Every Day Smoker    Packs/day: 1.00    Years: 30.00    Pack years: 30.00    Types: Cigarettes  . Smokeless tobacco: Never Used  Substance and Sexual Activity  .  Alcohol use: No    Alcohol/week: 0.0 standard drinks  . Drug use: No  . Sexual activity: Not Currently  Other Topics Concern  . Not on file  Social History Narrative  . Not on file   Social Determinants of Health   Financial Resource Strain:   . Difficulty of Paying Living Expenses: Not on file  Food Insecurity:   . Worried About Charity fundraiser in the Last Year: Not on file  . Ran Out of Food in the Last Year: Not on file  Transportation Needs:   . Lack of Transportation (Medical): Not on file  . Lack of Transportation (Non-Medical): Not on file  Physical Activity:   . Days of Exercise per Week: Not on file  . Minutes of Exercise per Session: Not on file  Stress:   . Feeling of Stress : Not on file  Social Connections:   . Frequency of Communication with Friends and Family: Not on file  . Frequency of Social Gatherings with Friends and Family: Not on file  . Attends Religious Services: Not on file  . Active Member of Clubs or Organizations: Not on file  . Attends Archivist Meetings: Not on file  . Marital Status: Not on file  Intimate  Partner Violence:   . Fear of Current or Ex-Partner: Not on file  . Emotionally Abused: Not on file  . Physically Abused: Not on file  . Sexually Abused: Not on file    Outpatient Medications Prior to Visit  Medication Sig Dispense Refill  . ferrous sulfate (FERROUSUL) 325 (65 FE) MG tablet Take 1 tablet (325 mg total) by mouth daily with breakfast. 30 tablet 3  . megestrol (MEGACE) 40 MG tablet TAKE 1 TABLET BY MOUTH 2 TIMES DAILY. CAN INCREASE TO TWO TABLETS TWICE A DAY IN THE EVENT OF HEAVY BLEEDING 60 tablet 7  . escitalopram (LEXAPRO) 10 MG tablet Take 1/2 (one half) tablet by mouth for 2 weeks. If needed can increase to 1 (one) tablet in 2 weeks. Please mail. (Patient not taking: Reported on 03/08/2019) 90 tablet 1   No facility-administered medications prior to visit.    Allergies  Allergen Reactions  . Hydrocodone      ROS Review of Systems  Constitutional: Positive for fatigue. Negative for chills and fever.  HENT: Negative for sore throat and trouble swallowing.   Respiratory: Negative for cough and shortness of breath.   Cardiovascular: Negative for chest pain and palpitations.  Gastrointestinal: Negative for abdominal pain, constipation, diarrhea and nausea.  Endocrine: Negative for polydipsia, polyphagia and polyuria.  Genitourinary: Negative for dysuria and frequency.  Skin:       Enlarging cyst/nodule on the front of the neck  Neurological: Negative for dizziness and headaches.  Hematological: Negative for adenopathy. Does not bruise/bleed easily.  Psychiatric/Behavioral: Positive for sleep disturbance. Negative for self-injury and suicidal ideas. The patient is nervous/anxious.       Objective:    Physical Exam  Constitutional: She is oriented to person, place, and time. She appears well-developed and well-nourished. No distress.  Neck: No JVD present.  Patient with visible infected nodule/sebaceous cyst on lower anterior neck  Cardiovascular: Normal rate and regular rhythm.  Pulmonary/Chest: Effort normal and breath sounds normal.  Abdominal: Soft. There is no abdominal tenderness. There is no rebound and no guarding.  Musculoskeletal:     Cervical back: Normal range of motion and neck supple.  Lymphadenopathy:    She has no cervical adenopathy.  Neurological: She is alert and oriented to person, place, and time.  Skin:  Grape sized pustular nodule/sebaceous cyst on the lower anterior neck.  Area is tender to palpation and slightly warm to touch along with surrounding skin  Psychiatric: Her behavior is normal.  Patient with slightly flattened affect versus being fatigued  Nursing note and vitals reviewed.   BP (!) 156/93 (BP Location: Left Arm, Patient Position: Sitting, Cuff Size: Normal)   Pulse 77   Temp 99.2 F (37.3 C) (Oral)   Ht 5\' 11"  (1.803 m)   Wt 149 lb (67.6 kg)    LMP 06/09/2014   SpO2 97%   BMI 20.78 kg/m  Wt Readings from Last 3 Encounters:  03/08/19 149 lb (67.6 kg)  07/31/14 164 lb 9.6 oz (74.7 kg)  06/26/14 161 lb 14.4 oz (73.4 kg)     Health Maintenance Due  Topic Date Due  . HIV Screening  10/14/1986  . TETANUS/TDAP  10/14/1990  . PAP SMEAR-Modifier  06/25/2017  . INFLUENZA VACCINE  09/23/2018     Lab Results  Component Value Date   TSH 0.329 (L) 06/26/2014   Lab Results  Component Value Date   WBC 6.9 06/26/2014   HGB 14.0 06/26/2014   HCT 43.0 06/26/2014  MCV 82.4 06/26/2014   PLT 281 06/26/2014   Lab Results  Component Value Date   NA 135 05/07/2014   K 5.0 05/07/2014   CO2 26 05/07/2014   GLUCOSE 84 05/07/2014   BUN 11 05/07/2014   CREATININE 0.55 05/07/2014   BILITOT 0.5 05/02/2014   ALKPHOS 48 05/02/2014   AST 13 05/02/2014   ALT 13 05/02/2014   PROT 7.1 05/02/2014   ALBUMIN 4.0 05/02/2014   CALCIUM 9.1 05/07/2014   ANIONGAP 5 05/03/2014   No results found for: CHOL No results found for: HDL No results found for: LDLCALC No results found for: TRIG No results found for: CHOLHDL No results found for: HGBA1C    Assessment & Plan:  1. Sebaceous cyst Patient with a painful sebaceous cyst on the lower anterior neck. Prescription provided for Augmentin 875 to take twice daily. She was made aware that if she develops stomach upset or diarrhea, that she should decrease dose to taking half pill twice daily. She is encouraged to eat yogurt with active cultures while she is on the antibiotic. Patient is also encouraged to use warm moist compresses to the area of the sebaceous cyst. RX provided for Tylenol #3 to help with pain.  She will be referred to general surgery for further evaluation and treatment/removal of the sebaceous cyst pocket. Handout on sebaceous cyst provided as part of after visit summary.  - amoxicillin-clavulanate (AUGMENTIN) 875-125 MG tablet; Take 1 tablet by mouth 2 (two) times daily. Take  after eating  Dispense: 20 tablet; Refill: 0 - Ambulatory referral to General Surgery - acetaminophen-codeine (TYLENOL #3) 300-30 MG tablet; Take 1-2 tablets by mouth every 4 (four) hours as needed for up to 5 days for moderate pain.  Dispense: 45 tablet; Refill: 0  2. Anxiety Patient with complaint of anxiety and sleep disturbance related to the current COVID-19 crisis. She admits to long standing issues with anxiety. Treatment options discussed with the patient and she agrees to try Sertraline. RX provided for Sertraline 50 mg and she will take 1/2 pill nightly for the first six nights then increase to  1 whole pill at bedtime. She is aware that it may take up to 4 weeks for medication to reach it's maximal effectiveness.  She should call or return sooner if she has any issues with the medication or no improvement. If she has suicidal thoughts or ideations she should go to the ED for further evaluation.  - sertraline (ZOLOFT) 50 MG tablet; Take 1 tablet (50 mg total) by mouth daily.  Dispense: 30 tablet; Refill: 1   An After Visit Summary was printed and given to the patient.  Follow-up: Return in about 6 weeks (around 04/19/2019) for anxiety.   Antony Blackbird, MD

## 2019-03-08 NOTE — Progress Notes (Signed)
Pt. Is here for a cyst on her neck.  She had it removed twice and it came back. Painful.

## 2019-03-13 ENCOUNTER — Encounter: Payer: Self-pay | Admitting: Family Medicine

## 2019-03-26 ENCOUNTER — Other Ambulatory Visit: Payer: Self-pay | Admitting: Obstetrics & Gynecology

## 2019-03-27 ENCOUNTER — Ambulatory Visit: Payer: Self-pay | Admitting: Nurse Practitioner

## 2019-03-27 MED FILL — MEGESTROL 40 MG TABLET: 40 | 15 days supply | Qty: 60 | Fill #0

## 2019-04-18 MED FILL — MEGESTROL 40 MG TABLET: 40 | 15 days supply | Qty: 60 | Fill #1

## 2019-04-20 ENCOUNTER — Ambulatory Visit: Payer: Self-pay | Admitting: Nurse Practitioner

## 2019-04-20 ENCOUNTER — Ambulatory Visit: Payer: Self-pay | Attending: Nurse Practitioner

## 2019-04-20 ENCOUNTER — Other Ambulatory Visit: Payer: Self-pay

## 2019-04-25 ENCOUNTER — Ambulatory Visit: Payer: Self-pay | Attending: Nurse Practitioner | Admitting: Nurse Practitioner

## 2019-04-25 ENCOUNTER — Other Ambulatory Visit: Payer: Self-pay

## 2019-04-25 ENCOUNTER — Encounter: Payer: Self-pay | Admitting: Nurse Practitioner

## 2019-04-25 DIAGNOSIS — R7989 Other specified abnormal findings of blood chemistry: Secondary | ICD-10-CM

## 2019-04-25 DIAGNOSIS — Z131 Encounter for screening for diabetes mellitus: Secondary | ICD-10-CM

## 2019-04-25 DIAGNOSIS — F419 Anxiety disorder, unspecified: Secondary | ICD-10-CM

## 2019-04-25 DIAGNOSIS — Z13220 Encounter for screening for lipoid disorders: Secondary | ICD-10-CM

## 2019-04-25 DIAGNOSIS — F32A Depression, unspecified: Secondary | ICD-10-CM

## 2019-04-25 DIAGNOSIS — Z13228 Encounter for screening for other metabolic disorders: Secondary | ICD-10-CM

## 2019-04-25 DIAGNOSIS — F329 Major depressive disorder, single episode, unspecified: Secondary | ICD-10-CM

## 2019-04-25 DIAGNOSIS — D649 Anemia, unspecified: Secondary | ICD-10-CM

## 2019-04-25 MED ORDER — PAROXETINE HCL 10 MG PO TABS: 10.0000 mg | ORAL_TABLET | Freq: Every day | ORAL | 1 refills | Status: DC

## 2019-04-25 MED ORDER — HYDROXYZINE HCL 10 MG PO TABS: 10.0000 mg | ORAL_TABLET | Freq: Three times a day (TID) | ORAL | 0 refills | Status: DC | PRN

## 2019-04-25 MED FILL — PAROXETINE HCL 10 MG TABS: 10 | 30 days supply | Qty: 30 | Fill #0

## 2019-04-25 MED FILL — hydrOXYzine HCL 10 MG TABS: 10 | 10 days supply | Qty: 30 | Fill #0

## 2019-04-25 NOTE — Progress Notes (Signed)
Virtual Visit via Telephone Note Due to national recommendations of social distancing due to [place] 19, telehealth visit is felt to be most appropriate for this patient at this time.  I discussed the limitations, risks, security and privacy concerns of performing an evaluation and management service by telephone and the availability of in person appointments. I also discussed with the patient that there may be a patient responsible charge related to this service. The patient expressed understanding and agreed to proceed.    I connected with Kendra [name] on 04/25/19  at   9:50 AM EST  EDT by telephone and verified that I am speaking with the correct person using two identifiers.   Consent I discussed the limitations, risks, security and privacy concerns of performing an evaluation and management service by telephone and the availability of in person appointments. I also discussed with the patient that there may be a patient responsible charge related to this service. The patient expressed understanding and agreed to proceed.   Location of Patient: Private  Residence   Location of Provider: [place] and [place] participating in Telemedicine visit: Geryl Rankins FNP-BC [place]    History of Present Illness: Telemedicine visit for: Anxiety and Depression  She has taken lexapro 10 mg and sertraline 25-'50mg'$ . Neither of which she states were effective. Endorses increased stress in regard to her finances and personal life. She lost her husband 2 years ago and still grieving. I will refer her to our [facility] program for assistance with medication management and psychotherapy. Denies any thoughts of self harm.  Patient and/or legal guardian verbally consented to [id number] services about presenting concerns and psychiatric consultation as appropriate. Depression screen [facility] 2/9 04/25/2019 03/08/2019 08/23/2018  Decreased Interest 0 1 0   Down, Depressed, Hopeless 0 2 1  PHQ - 2 Score 0 3 1  Altered sleeping 0 3 1  Tired, decreased energy 0 3 1  Change in appetite 0 2 0  Feeling bad or failure about yourself  0 1 0  Trouble concentrating 1 2 0  Moving slowly or fidgety/restless 0 0 0  Suicidal thoughts 0 0 0  PHQ-9 Score '1 14 3   '$ GAD 7 : Generalized Anxiety Score 04/25/2019 03/08/2019 08/23/2018  Nervous, Anxious, on Edge '1 3 1  '$ Control/stop worrying '2 3 2  '$ Worry too much - different things '2 3 2  '$ Trouble relaxing '3 3 1  '$ Restless 0 3 0  Easily annoyed or irritable '3 3 3  '$ Afraid - awful might happen 0 3 0  Total GAD 7 Score '11 21 9       '$ Past Medical History:  Diagnosis Date  . Anemia   . Fibroid, uterine     Past Surgical History:  Procedure Laterality Date  . DILATION AND CURETTAGE OF UTERUS  2004    Family History  Problem Relation Age of Onset  . Hypertension Mother   . Heart disease Father   . Cancer Maternal Grandfather     Social History   Socioeconomic History  . Marital status: Widowed    Spouse name: Not on file  . Number of children: Not on file  . Years of education: Not on file  . Highest education level: Not on file  Occupational History  . Not on file  Tobacco Use  . Smoking status: Current Every Day Smoker    Packs/day: 1.00    Years: 30.00  Pack years: 30.00    Types: Cigarettes  . Smokeless tobacco: Never Used  Substance and Sexual Activity  . Alcohol use: No    Alcohol/week: 0.0 standard drinks  . Drug use: No  . Sexual activity: Not Currently  Other Topics Concern  . Not on file  Social History Narrative  . Not on file   Social Determinants of Health   Financial Resource Strain:   . Difficulty of Paying Living Expenses: Not on file  Food Insecurity:   . Worried About Charity fundraiser in the Last Year: Not on file  . Ran Out of Food in the Last Year: Not on file  Transportation Needs:   . Lack of Transportation (Medical): Not on file  . Lack of  Transportation (Non-Medical): Not on file  Physical Activity:   . Days of Exercise per Week: Not on file  . Minutes of Exercise per Session: Not on file  Stress:   . Feeling of Stress : Not on file  Social Connections:   . Frequency of Communication with Friends and Family: Not on file  . Frequency of Social Gatherings with Friends and Family: Not on file  . Attends Religious Services: Not on file  . Active Member of Clubs or Organizations: Not on file  . Attends Archivist Meetings: Not on file  . Marital Status: Not on file     Observations/Objective: Awake, alert and oriented x 3   Review of Systems  Constitutional: Negative for fever, malaise/fatigue and weight loss.  HENT: Negative.  Negative for nosebleeds.   Eyes: Negative.  Negative for blurred vision, double vision and photophobia.  Respiratory: Negative.  Negative for cough and shortness of breath.   Cardiovascular: Negative.  Negative for chest pain, palpitations and leg swelling.  Gastrointestinal: Negative.  Negative for heartburn, nausea and vomiting.  Musculoskeletal: Negative.  Negative for myalgias.  Neurological: Negative.  Negative for dizziness, focal weakness, seizures and headaches.  Psychiatric/Behavioral: Positive for depression. Negative for suicidal ideas. The patient is nervous/anxious.     Assessment and Plan: Kendra [name] was seen today for follow-up.  Diagnoses and all orders for this visit:  Anxiety and depression -     PARoxetine (PAXIL) 10 MG tablet; Take 1 tablet (10 mg total) by mouth daily. -     hydrOXYzine (ATARAX/VISTARIL) 10 MG tablet; Take 1 tablet (10 mg total) by mouth 3 (three) times daily as needed for anxiety. -     Ambulatory referral to [facility]  Encounter for screening for diabetes mellitus -     Hemoglobin A1c; Future  Abnormal TSH -     TSH; Future  Anemia, unspecified type -     CBC; Future  Screening for metabolic disorder -     PNT61+WERX;  Future  Lipid screening -     Lipid panel; Future     Follow Up Instructions Return in about 2 weeks (around 05/09/2019).     I discussed the assessment and treatment plan with the patient. The patient was provided an opportunity to ask questions and all were answered. The patient agreed with the plan and demonstrated an understanding of the instructions.   The patient was advised to call back or seek an in-person evaluation if the symptoms worsen or if the condition fails to improve as anticipated.  I provided 19 minutes of non-face-to-face time during this encounter including median intraservice time, reviewing previous notes, labs, imaging, medications and explaining diagnosis and management.  Vernia Buff  Raul Del, FNP-BC

## 2019-04-30 ENCOUNTER — Telehealth: Payer: Self-pay

## 2019-04-30 NOTE — Telephone Encounter (Signed)
Telephoned patient at home number. Patient stated she would call back and schedule with [facility], after confirming her work schedule.

## 2019-05-01 ENCOUNTER — Other Ambulatory Visit: Payer: Self-pay

## 2019-05-03 ENCOUNTER — Ambulatory Visit: Payer: Self-pay | Admitting: Surgery

## 2019-05-10 ENCOUNTER — Ambulatory Visit: Payer: Self-pay | Admitting: Surgery

## 2019-05-11 ENCOUNTER — Encounter: Payer: Self-pay | Admitting: Nurse Practitioner

## 2019-05-11 ENCOUNTER — Ambulatory Visit: Payer: Self-pay | Attending: Nurse Practitioner | Admitting: Nurse Practitioner

## 2019-05-11 DIAGNOSIS — F329 Major depressive disorder, single episode, unspecified: Secondary | ICD-10-CM

## 2019-05-11 DIAGNOSIS — R7989 Other specified abnormal findings of blood chemistry: Secondary | ICD-10-CM

## 2019-05-11 DIAGNOSIS — Z13220 Encounter for screening for lipoid disorders: Secondary | ICD-10-CM

## 2019-05-11 DIAGNOSIS — Z13228 Encounter for screening for other metabolic disorders: Secondary | ICD-10-CM

## 2019-05-11 DIAGNOSIS — Z131 Encounter for screening for diabetes mellitus: Secondary | ICD-10-CM

## 2019-05-11 DIAGNOSIS — F419 Anxiety disorder, unspecified: Secondary | ICD-10-CM

## 2019-05-11 DIAGNOSIS — D649 Anemia, unspecified: Secondary | ICD-10-CM

## 2019-05-11 MED ORDER — HYDROXYZINE HCL 10 MG PO TABS: 10.0000 mg | ORAL_TABLET | Freq: Three times a day (TID) | ORAL | 3 refills | Status: DC | PRN

## 2019-05-11 MED FILL — hydrOXYzine HCL 10 MG TABS: 10 | 30 days supply | Qty: 90 | Fill #0

## 2019-05-11 NOTE — Progress Notes (Signed)
Virtual Visit via Telephone Note Due to national recommendations of social distancing due to [place] 19, telehealth visit is felt to be most appropriate for this patient at this time.  I discussed the limitations, risks, security and privacy concerns of performing an evaluation and management service by telephone and the availability of in person appointments. I also discussed with the patient that there may be a patient responsible charge related to this service. The patient expressed understanding and agreed to proceed.    I connected with Kendra [name] on 05/11/19  at   2:10 PM EDT  EDT by telephone and verified that I am speaking with the correct person using two identifiers.   Consent I discussed the limitations, risks, security and privacy concerns of performing an evaluation and management service by telephone and the availability of in person appointments. I also discussed with the patient that there may be a patient responsible charge related to this service. The patient expressed understanding and agreed to proceed.   Location of Patient: Private Residence    Location of Provider: [place] and [place] participating in Telemedicine visit: Geryl Rankins FNP-BC [place]    History of Present Illness: Telemedicine visit for: Follow Up/Anxiety and Depression  REFERRED TO [facility] FOR MAMMOGRAM SCHEDULING   Stopped taking Paxil because she could not focus.  PHQ9 score had decreased. GAD score still elevated. She declines restarting an alternative SSRI but will continue on hydroxyzine for now. She does not drowsiness at work when taking 10 mg. I have instructed her to take half a tablet to see if this will help.   Depression screen [facility] 2/9 04/25/2019 03/08/2019 08/23/2018  Decreased Interest 0 1 0  Down, Depressed, Hopeless 0 2 1  PHQ - 2 Score 0 3 1  Altered sleeping 0 3 1  Tired, decreased energy 0 3 1  Change in appetite 0 2 0   Feeling bad or failure about yourself  0 1 0  Trouble concentrating 1 2 0  Moving slowly or fidgety/restless 0 0 0  Suicidal thoughts 0 0 0  PHQ-9 Score 1 14 3     GAD 7 : Generalized Anxiety Score 04/25/2019 03/08/2019 08/23/2018  Nervous, Anxious, on Edge 1 3 1   Control/stop worrying 2 3 2   Worry too much - different things 2 3 2   Trouble relaxing 3 3 1   Restless 0 3 0  Easily annoyed or irritable 3 3 3   Afraid - awful might happen 0 3 0  Total GAD 7 Score 11 21 9      Past Medical History:  Diagnosis Date  . Anemia   . Fibroid, uterine     Past Surgical History:  Procedure Laterality Date  . DILATION AND CURETTAGE OF UTERUS  2004    Family History  Problem Relation Age of Onset  . Hypertension Mother   . Heart disease Father   . Cancer Maternal Grandfather     Social History   Socioeconomic History  . Marital status: Widowed    Spouse name: Not on file  . Number of children: Not on file  . Years of education: Not on file  . Highest education level: Not on file  Occupational History  . Not on file  Tobacco Use  . Smoking status: Current Every Day Smoker    Packs/day: 1.00    Years: 30.00    Pack years: 30.00    Types: Cigarettes  . Smokeless  tobacco: Never Used  Substance and Sexual Activity  . Alcohol use: No    Alcohol/week: 0.0 standard drinks  . Drug use: No  . Sexual activity: Not Currently  Other Topics Concern  . Not on file  Social History Narrative  . Not on file   Social Determinants of Health   Financial Resource Strain:   . Difficulty of Paying Living Expenses:   Food Insecurity:   . Worried About Charity fundraiser in the Last Year:   . Arboriculturist in the Last Year:   Transportation Needs:   . Film/video editor (Medical):   Marland Kitchen Lack of Transportation (Non-Medical):   Physical Activity:   . Days of Exercise per Week:   . Minutes of Exercise per Session:   Stress:   . Feeling of Stress :   Social Connections:   . Frequency  of Communication with Friends and Family:   . Frequency of Social Gatherings with Friends and Family:   . Attends Religious Services:   . Active Member of Clubs or Organizations:   . Attends Archivist Meetings:   Marland Kitchen Marital Status:      Observations/Objective: Awake, alert and oriented x 3   Review of Systems  Constitutional: Negative for fever, malaise/fatigue and weight loss.  HENT: Negative.  Negative for nosebleeds.   Eyes: Negative.  Negative for blurred vision, double vision and photophobia.  Respiratory: Negative.  Negative for cough and shortness of breath.   Cardiovascular: Negative.  Negative for chest pain, palpitations and leg swelling.  Gastrointestinal: Negative.  Negative for heartburn, nausea and vomiting.  Musculoskeletal: Negative.  Negative for myalgias.  Neurological: Negative.  Negative for dizziness, focal weakness, seizures and headaches.  Psychiatric/Behavioral: Positive for depression. Negative for suicidal ideas. The patient is nervous/anxious.     Assessment and Plan: Kendra [name] was seen today for follow-up.  Diagnoses and all orders for this visit:  Anxiety and depression -     hydrOXYzine (ATARAX/VISTARIL) 10 MG tablet; Take 1 tablet (10 mg total) by mouth 3 (three) times daily as needed for anxiety.  Abnormal TSH -     TSH  Lipid screening -     Lipid panel  Screening for metabolic disorder -     UDT14+[id number]  Anemia, unspecified type -     CBC  Encounter for screening for diabetes mellitus -     Hemoglobin A1c     Follow Up Instructions Return for PAP SMEAR.     I discussed the assessment and treatment plan with the patient. The patient was provided an opportunity to ask questions and all were answered. The patient agreed with the plan and demonstrated an understanding of the instructions.   The patient was advised to call back or seek an in-person evaluation if the symptoms worsen or if the condition fails to improve as  anticipated.  I provided 14 minutes of non-face-to-face time during this encounter including median intraservice time, reviewing previous notes, labs, imaging, medications and explaining diagnosis and management.  Gildardo Pounds, FNP-BC

## 2019-05-12 LAB — CBC
Hematocrit: 47.3 % — ABNORMAL HIGH (ref 34.0–46.6)
Hemoglobin: 15.7 g/dL (ref 11.1–15.9)
MCH: 29.6 pg (ref 26.6–33.0)
MCHC: 33.2 g/dL (ref 31.5–35.7)
MCV: 89 fL (ref 79–97)
Platelets: 260 10*3/uL (ref 150–450)
RBC: 5.3 x10E6/uL — ABNORMAL HIGH (ref 3.77–5.28)
RDW: 12.1 % (ref 11.7–15.4)
WBC: 6.6 10*3/uL (ref 3.4–10.8)

## 2019-05-12 LAB — CMP14+EGFR
ALT: 18 IU/L (ref 0–32)
AST: 9 IU/L (ref 0–40)
Albumin/Globulin Ratio: 1.8 (ref 1.2–2.2)
Albumin: 4.4 g/dL (ref 3.8–4.8)
Alkaline Phosphatase: 74 IU/L (ref 39–117)
BUN/Creatinine Ratio: 30 — ABNORMAL HIGH (ref 9–23)
BUN: 20 mg/dL (ref 6–24)
Bilirubin Total: <0.2 mg/dL (ref 0.0–1.2)
CO2: 23 mmol/L (ref 20–29)
Calcium: 9.4 mg/dL (ref 8.7–10.2)
Chloride: 103 mmol/L (ref 96–106)
Creatinine, Ser: 0.66 mg/dL (ref 0.57–1.00)
GFR calc Af Amer: 122 mL/min/{1.73_m2} (ref >59)
GFR calc non Af Amer: 106 mL/min/{1.73_m2} (ref >59)
Globulin, Total: 2.5 g/dL (ref 1.5–4.5)
Glucose: 73 mg/dL (ref 65–99)
Potassium: 4.1 mmol/L (ref 3.5–5.2)
Sodium: 141 mmol/L (ref 134–144)
Total Protein: 6.9 g/dL (ref 6.0–8.5)

## 2019-05-12 LAB — LIPID PANEL
Chol/HDL Ratio: 4.1 ratio (ref 0.0–4.4)
Cholesterol, Total: 150 mg/dL (ref 100–199)
HDL: 37 mg/dL — ABNORMAL LOW (ref >39)
LDL Chol Calc (NIH): 88 mg/dL (ref 0–99)
Triglycerides: 140 mg/dL (ref 0–149)
VLDL Cholesterol Cal: 25 mg/dL (ref 5–40)

## 2019-05-12 LAB — TSH: TSH: 0.742 u[IU]/mL (ref 0.450–4.500)

## 2019-05-12 LAB — HEMOGLOBIN A1C
Est. average glucose Bld gHb Est-mCnc: 103 mg/dL
Hgb A1c MFr Bld: 5.2 % (ref 4.8–5.6)

## 2019-05-21 ENCOUNTER — Ambulatory Visit: Payer: Self-pay | Attending: Internal Medicine

## 2019-05-21 ENCOUNTER — Other Ambulatory Visit: Payer: Self-pay

## 2019-05-21 DIAGNOSIS — Z20822 Contact with and (suspected) exposure to covid-19: Secondary | ICD-10-CM | POA: Insufficient documentation

## 2019-05-23 LAB — SARS-COV-2, NAA 2 DAY TAT

## 2019-05-23 LAB — NOVEL CORONAVIRUS, NAA: SARS-CoV-2, NAA: NOT DETECTED

## 2019-05-23 MED FILL — MEGESTROL 40 MG TABLET: 40 | 15 days supply | Qty: 60 | Fill #2

## 2019-05-24 MED FILL — hydrOXYzine HCL 10 MG TABS: 10 | 30 days supply | Qty: 90 | Fill #0

## 2019-06-25 MED FILL — MEGESTROL 40 MG TABLET: 40 | 15 days supply | Qty: 60 | Fill #3

## 2019-07-05 ENCOUNTER — Encounter: Payer: Self-pay | Admitting: *Deleted

## 2019-08-29 MED FILL — MEGESTROL 40 MG TABLET: 40 | 15 days supply | Qty: 60 | Fill #5

## 2019-10-01 MED FILL — MEGESTROL 40 MG TABLET: 40 | 15 days supply | Qty: 60 | Fill #6

## 2019-11-06 MED FILL — MEGESTROL 40 MG TABLET: 40 | 15 days supply | Qty: 60 | Fill #7

## 2019-11-22 ENCOUNTER — Other Ambulatory Visit: Payer: Self-pay

## 2019-11-22 DIAGNOSIS — Z20822 Contact with and (suspected) exposure to covid-19: Secondary | ICD-10-CM

## 2019-11-24 LAB — SARS-COV-2, NAA 2 DAY TAT

## 2019-11-24 LAB — NOVEL CORONAVIRUS, NAA: SARS-CoV-2, NAA: DETECTED — AB

## 2019-11-24 LAB — SPECIMEN STATUS REPORT

## 2019-11-25 ENCOUNTER — Telehealth: Payer: Self-pay | Admitting: Infectious Diseases

## 2019-11-25 NOTE — Telephone Encounter (Signed)
Called to Discuss with patient about Covid symptoms and the use of the monoclonal antibody infusion for those with mild to moderate Covid symptoms and at a high risk of hospitalization.     Pt appears to qualify for this infusion due to co-morbid conditions and/or a member of an at-risk group in accordance with the FDA Emergency Use Authorization.    Unable to reach pt - LVM and sent a MyChart   Qualifiers for Monoclonal infusion include active/heavy smoking history. Unclear symptom onset.    Janene Madeira, MSN, NP-C [facility] [email or web address] Pager: [phone] Office: [phone] [place]: [phone]

## 2019-11-25 NOTE — Telephone Encounter (Signed)
Message was read by patient Last read by Linus Galas at 12:35 PM on 11/25/2019.

## 2019-12-03 ENCOUNTER — Other Ambulatory Visit: Payer: Self-pay | Admitting: Obstetrics & Gynecology

## 2019-12-06 ENCOUNTER — Other Ambulatory Visit: Payer: Self-pay | Admitting: Obstetrics & Gynecology

## 2019-12-06 MED FILL — MEGESTROL 40 MG TABLET: 40 | 15 days supply | Qty: 60 | Fill #0

## 2020-01-10 MED FILL — MEGESTROL 40 MG TABLET: 40 | 15 days supply | Qty: 60 | Fill #1

## 2020-02-08 MED FILL — MEGESTROL 40 MG TABLET: 40 | 15 days supply | Qty: 60 | Fill #2

## 2020-03-17 MED FILL — MEGESTROL 40 MG TABLET: 40 | 15 days supply | Qty: 60 | Fill #3

## 2020-04-15 MED FILL — MEGESTROL 40 MG TABLET: 40 | 15 days supply | Qty: 60 | Fill #4

## 2020-05-26 ENCOUNTER — Other Ambulatory Visit: Payer: Self-pay | Admitting: Nurse Practitioner

## 2020-05-26 ENCOUNTER — Other Ambulatory Visit: Payer: Self-pay

## 2020-05-26 MED FILL — Megestrol Acetate Tab 40 MG: ORAL | 15 days supply | Qty: 60 | Fill #0 | Status: AC

## 2020-05-26 NOTE — Telephone Encounter (Signed)
   Notes to clinic:  medication filled by a different provider  Review fore refill Unsuccessful reaching pharmacy    Requested Prescriptions  Pending Prescriptions Disp Refills   megestrol (MEGACE) 40 MG tablet 60 tablet 7    Sig: TAKE 1 TABLET BY MOUTH 2 TIMES DAILY. CAN INCREASE TO TWO TABLETS TWICE A DAY IN THE EVENT OF HEAVY BLEEDING      OB/GYN:  Progestins Failed - 05/26/2020 10:26 AM      Failed - Valid encounter within last 12 months    Recent Outpatient Visits           1 year ago Anxiety and depression   [place], Vernia Buff, NP   1 year ago Anxiety and depression   [place], Vernia Buff, NP   1 year ago Sebaceous cyst   [place], [place], MD   1 year ago Mild episode of recurrent major depressive disorder [facility])   [place], Zelda W, NP   6 years ago Uterine leiomyoma, unspecified location   [facility] Lorayne Marek, MD

## 2020-05-26 NOTE — Telephone Encounter (Signed)
Copied from [place] [id number]. Topic: Quick Communication - Rx Refill/Question >> May 26, 2020 10:17 AM Tessa Lerner A wrote: Medication: Rx #: [id number]  megestrol (MEGACE) 40 MG tablet - patient has 3(three) tablets remaining  Has the patient contacted their pharmacy? Yes. Patient has attempted to call their pharmacy, even with the help of agent, and been unsuccessful   Preferred Pharmacy (with phone number or street name): [facility] and [place]  Phone:  [phone]  Agent: Please be advised that RX refills may take up to 3 business days. We ask that you follow-up with your pharmacy.

## 2020-05-27 ENCOUNTER — Other Ambulatory Visit: Payer: Self-pay

## 2020-06-06 ENCOUNTER — Other Ambulatory Visit: Payer: Self-pay

## 2020-06-09 ENCOUNTER — Other Ambulatory Visit: Payer: Self-pay

## 2020-06-23 ENCOUNTER — Other Ambulatory Visit: Payer: Self-pay

## 2020-06-23 MED FILL — Megestrol Acetate Tab 40 MG: ORAL | 15 days supply | Qty: 60 | Fill #1 | Status: AC

## 2020-06-24 ENCOUNTER — Other Ambulatory Visit: Payer: Self-pay

## 2020-08-12 ENCOUNTER — Other Ambulatory Visit: Payer: Self-pay

## 2020-08-12 MED FILL — Megestrol Acetate Tab 40 MG: ORAL | 15 days supply | Qty: 60 | Fill #2 | Status: AC

## 2020-08-14 ENCOUNTER — Other Ambulatory Visit: Payer: Self-pay

## 2020-09-01 ENCOUNTER — Other Ambulatory Visit: Payer: Self-pay

## 2020-09-09 ENCOUNTER — Other Ambulatory Visit: Payer: Self-pay

## 2020-09-10 ENCOUNTER — Other Ambulatory Visit: Payer: Self-pay | Admitting: Obstetrics & Gynecology

## 2020-09-15 ENCOUNTER — Other Ambulatory Visit: Payer: Self-pay

## 2020-09-29 ENCOUNTER — Telehealth: Payer: Self-pay | Admitting: Obstetrics & Gynecology

## 2020-09-29 NOTE — Telephone Encounter (Signed)
Per chart review, pt has not been seen in our office since 07/31/2014. Called pt at primary number; VM left stating pt will need to be seen by provider prior to any refill being given. Explained pt may call to schedule sooner appt with different provider if she would like. Called secondary number, no longer in service.

## 2020-09-29 NOTE — Telephone Encounter (Signed)
Patient has been placed on waiting list to be scheduled with Dr.Anyanuw for October, she is requesting a medication that she have been on to help stop her bleeding, she is requesting enough to hold her over till October, she only want to see Dr.A

## 2021-01-01 ENCOUNTER — Encounter (HOSPITAL_COMMUNITY): Payer: Self-pay

## 2021-01-01 ENCOUNTER — Other Ambulatory Visit: Payer: Self-pay

## 2021-01-01 ENCOUNTER — Emergency Department (HOSPITAL_COMMUNITY)
Admission: EM | Admit: 2021-01-01 | Discharge: 2021-01-01 | Disposition: A | Discharge status: Home / Self Care | Payer: Self-pay | Attending: Emergency Medicine | Admitting: Emergency Medicine

## 2021-01-01 DIAGNOSIS — R102 Pelvic and perineal pain: Secondary | ICD-10-CM | POA: Insufficient documentation

## 2021-01-01 DIAGNOSIS — F17210 Nicotine dependence, cigarettes, uncomplicated: Secondary | ICD-10-CM | POA: Insufficient documentation

## 2021-01-01 DIAGNOSIS — N939 Abnormal uterine and vaginal bleeding, unspecified: Secondary | ICD-10-CM | POA: Insufficient documentation

## 2021-01-01 LAB — COMPREHENSIVE METABOLIC PANEL
ALT: 20 U/L (ref 0–44)
AST: 12 U/L — ABNORMAL LOW (ref 15–41)
Albumin: 4.1 g/dL (ref 3.5–5.0)
Alkaline Phosphatase: 76 U/L (ref 38–126)
Anion gap: 6 (ref 5–15)
BUN: 23 mg/dL — ABNORMAL HIGH (ref 6–20)
CO2: 25 mmol/L (ref 22–32)
Calcium: 8.6 mg/dL — ABNORMAL LOW (ref 8.9–10.3)
Chloride: 104 mmol/L (ref 98–111)
Creatinine, Ser: 0.49 mg/dL (ref 0.44–1.00)
GFR, Estimated: >60 mL/min (ref >60)
Glucose, Bld: 99 mg/dL (ref 70–99)
Potassium: 3.6 mmol/L (ref 3.5–5.1)
Sodium: 135 mmol/L (ref 135–145)
Total Bilirubin: 0.3 mg/dL (ref 0.3–1.2)
Total Protein: 7.2 g/dL (ref 6.5–8.1)

## 2021-01-01 LAB — CBC WITH DIFFERENTIAL/PLATELET
Abs Immature Granulocytes: 0.01 10*3/uL (ref 0.00–0.07)
Basophils Absolute: 0 10*3/uL (ref 0.0–0.1)
Basophils Relative: 1 %
Eosinophils Absolute: 0.1 10*3/uL (ref 0.0–0.5)
Eosinophils Relative: 1 %
HCT: 47.4 % — ABNORMAL HIGH (ref 36.0–46.0)
Hemoglobin: 16 g/dL — ABNORMAL HIGH (ref 12.0–15.0)
Immature Granulocytes: 0 %
Lymphocytes Relative: 23 %
Lymphs Abs: 1.4 10*3/uL (ref 0.7–4.0)
MCH: 30 pg (ref 26.0–34.0)
MCHC: 33.8 g/dL (ref 30.0–36.0)
MCV: 88.9 fL (ref 80.0–100.0)
Monocytes Absolute: 0.5 10*3/uL (ref 0.1–1.0)
Monocytes Relative: 8 %
Neutro Abs: 4 10*3/uL (ref 1.7–7.7)
Neutrophils Relative %: 67 %
Platelets: 228 10*3/uL (ref 150–400)
RBC: 5.33 MIL/uL — ABNORMAL HIGH (ref 3.87–5.11)
RDW: 12.5 % (ref 11.5–15.5)
WBC: 6 10*3/uL (ref 4.0–10.5)
nRBC: 0 % (ref 0.0–0.2)

## 2021-01-01 LAB — I-STAT BETA HCG BLOOD, ED (MC, WL, AP ONLY): I-stat hCG, quantitative: <5 m[IU]/mL (ref <5)

## 2021-01-01 MED ORDER — MEGESTROL ACETATE 40 MG PO TABS: 40.0000 mg | ORAL_TABLET | Freq: Two times a day (BID) | ORAL | 0 refills | Status: AC

## 2021-01-01 MED ORDER — TRAMADOL HCL 50 MG PO TABS: 50.0000 mg | ORAL_TABLET | Freq: Four times a day (QID) | ORAL | 0 refills | Status: DC | PRN

## 2021-01-01 NOTE — ED Triage Notes (Addendum)
Pt has ovarian cysts, she did take meds for this, but has not taken the meds since August. Pt states she has been heavily bleeding since yesterday. Pt states she has filled up a pad today in 45 min. Pt states there are large clots. Pt c/o severe cramping. Pt states she has not had a menstrual cycle in 6 years.

## 2021-01-01 NOTE — ED Provider Notes (Signed)
Emergency Medicine Provider Triage Evaluation Note  Kendra [name] , a 50 y.o. female  was evaluated in triage.  Pt complains of vaginal bleeding that started yesterday. She has a h/o fibroids.  She was previously on medication that controlled her vaginal bleeding however she ran out of it in August and has been unable to get a refill from her OB/GYN  Review of Systems  Positive: Vaginal bleeding, pelvic pain Negative: syncope  Physical Exam  BP (!) 179/97   Pulse 77   Temp 98.2 F (36.8 C) (Oral)   Resp 18   LMP 06/09/2014   SpO2 100%  Gen:   Awake, no distress   Resp:  Normal effort  MSK:   Moves extremities without difficulty  Other:    Medical Decision Making  Medically screening exam initiated at 6:41 PM.  Appropriate orders placed.  AHMAYA OSTERMILLER was informed that the remainder of the evaluation will be completed by another provider, this initial triage assessment does not replace that evaluation, and the importance of remaining in the ED until their evaluation is complete.     Rodney Booze, PA-C 01/01/21 1841    Jeanell Sparrow, DO 01/02/21 [phone]

## 2021-01-01 NOTE — Discharge Instructions (Addendum)
Please call women's health care at [facility] tomorrow morning to set up an appointment for evaluation.  You may need an ultrasound scheduled. I have refilled your megestrol until you are able to be seen by the women's clinic.

## 2021-01-01 NOTE — ED Notes (Signed)
Pt care taken, complaining of vaginal bleeding for the last week, "real heavy" today.  1 pad every 2 hours

## 2021-01-01 NOTE — ED Provider Notes (Signed)
[facility] DEPT Provider Note   CSN: [id number] Arrival date & time: 01/01/21  1802     History Chief Complaint  Patient presents with   Vaginal Bleeding    Kendra [name] is a 50 y.o. female.  With past medical history of anemia, left ovarian cyst, abnormal uterine bleeding, fibroids.  She presented to the emergency department with 2 days of menstrual bleeding.  She has had blood clots associated this with heavy blood.  She has associated pelvic cramping bilaterally.  She has not had a menstrual cycle in 6 years because she has been on megestrol.  She ran out of this in August, and her OB/GYN would not represcribe it since they had not seen her in several years.  She was spotting on and off since stopping this, until yesterday where she had heavy vaginal bleeding.  Patient states she was unable to set up an appointment with them due to availability.  She was sent her since she had been anemic in the past for further evaluation.  She denies any nausea, vomiting, diarrhea, constipation, fevers, chills, vaginal discharge, dysuria, hematuria.   Vaginal Bleeding Associated symptoms: no abdominal pain, no back pain, no dizziness, no dysuria, no fever and no nausea       Past Medical History:  Diagnosis Date   Anemia    Fibroid, uterine     Patient Active Problem List   Diagnosis Date Noted   Abnormal uterine bleeding (AUB) 07/31/2014   Fibroids 07/31/2014   Preoperative exam for gynecologic surgery 07/31/2014   Left ovarian cyst 05/20/2014   Smoking 05/20/2014   Elevated BP 05/20/2014   Abdominal pain    Anemia 05/02/2014   Symptomatic anemia 05/02/2014    Past Surgical History:  Procedure Laterality Date   DILATION AND CURETTAGE OF UTERUS  2004     OB History     Gravida  1   Para  1   Term  1   Preterm      AB      Living  1      SAB      IAB      Ectopic      Multiple      Live Births              Family History   Problem Relation Age of Onset   Hypertension Mother    Heart disease Father    Cancer Maternal Grandfather     Social History   Tobacco Use   Smoking status: Every Day    Packs/day: 1.00    Years: 30.00    Pack years: 30.00    Types: Cigarettes   Smokeless tobacco: Never  Vaping Use   Vaping Use: Never used  Substance Use Topics   Alcohol use: No    Alcohol/week: 0.0 standard drinks   Drug use: No    Home Medications Prior to Admission medications   Medication Sig Start Date End Date Taking? Authorizing Provider  megestrol (MEGACE) 40 MG tablet Take 1 tablet (40 mg total) by mouth 2 (two) times daily for 10 days. 01/01/21 01/11/21 Yes Bronislaus Verdell, Adora Fridge, PA-C  ferrous sulfate (FERROUSUL) 325 (65 FE) MG tablet Take 1 tablet (325 mg total) by mouth daily with breakfast. 05/07/14   Micheline Chapman, NP  hydrOXYzine (ATARAX/VISTARIL) 10 MG tablet Take 1 tablet (10 mg total) by mouth 3 (three) times daily as needed for anxiety. 05/11/19   Gildardo Pounds, NP  traMADol (ULTRAM) 50 MG tablet Take 1 tablet (50 mg total) by mouth every 6 (six) hours as needed for up to 10 doses. 01/01/21   Shrihaan Porzio, Adora Fridge, PA-C    Allergies    Hydrocodone  Review of Systems   Review of Systems  Constitutional:  Negative for chills and fever.  HENT:  Negative for congestion, rhinorrhea and sore throat.   Eyes:  Negative for visual disturbance.  Respiratory:  Negative for cough, chest tightness and shortness of breath.   Cardiovascular:  Negative for chest pain, palpitations and leg swelling.  Gastrointestinal:  Negative for abdominal pain, blood in stool, constipation, diarrhea, nausea and vomiting.  Genitourinary:  Positive for pelvic pain and vaginal bleeding. Negative for dysuria, flank pain and hematuria.  Musculoskeletal:  Negative for back pain.  Skin:  Negative for rash and wound.  Neurological:  Negative for dizziness, syncope, weakness, light-headedness and headaches.   Psychiatric/Behavioral:  Negative for confusion.   All other systems reviewed and are negative.  Physical Exam Updated Vital Signs BP (!) 188/91   Pulse 73   Temp 98.2 F (36.8 C) (Oral)   Resp 18   LMP 06/09/2014   SpO2 98%   Physical Exam Vitals and nursing note reviewed.  Constitutional:      General: She is not in acute distress.    Appearance: Normal appearance. She is not ill-appearing, toxic-appearing or diaphoretic.  HENT:     Head: Normocephalic and atraumatic.     Nose: No nasal deformity.     Mouth/Throat:     Lips: Pink. No lesions.     Mouth: Mucous membranes are moist. No injury, lacerations, oral lesions or angioedema.     Pharynx: Oropharynx is clear. Uvula midline. No pharyngeal swelling, oropharyngeal exudate, posterior oropharyngeal erythema or uvula swelling.  Eyes:     General: Gaze aligned appropriately. No scleral icterus.       Right eye: No discharge.        Left eye: No discharge.     Conjunctiva/sclera: Conjunctivae normal.     Right eye: Right conjunctiva is not injected. No exudate or hemorrhage.    Left eye: Left conjunctiva is not injected. No exudate or hemorrhage. Cardiovascular:     Pulses: Normal pulses.          Radial pulses are 2+ on the right side and 2+ on the left side.       Dorsalis pedis pulses are 2+ on the right side and 2+ on the left side.     Heart sounds: Normal heart sounds, S1 normal and S2 normal. Heart sounds not distant. No murmur heard.   No friction rub. No gallop. No S3 or S4 sounds.  Pulmonary:     Effort: Pulmonary effort is normal. No accessory muscle usage or respiratory distress.     Breath sounds: Normal breath sounds. No stridor. No wheezing, rhonchi or rales.  Chest:     Chest wall: No tenderness.  Abdominal:     General: Abdomen is flat. Bowel sounds are normal. There is no distension.     Palpations: Abdomen is soft. There is no mass or pulsatile mass.     Tenderness: There is abdominal tenderness in  the suprapubic area. There is no guarding or rebound.  Musculoskeletal:     Right lower leg: No edema.     Left lower leg: No edema.  Skin:    General: Skin is warm and dry.     Coloration: Skin is not  jaundiced or pale.     Findings: No bruising, erythema, lesion or rash.  Neurological:     General: No focal deficit present.     Mental Status: She is alert and oriented to person, place, and time.     GCS: GCS eye subscore is 4. GCS verbal subscore is 5. GCS motor subscore is 6.  Psychiatric:        Mood and Affect: Mood normal.        Behavior: Behavior normal. Behavior is cooperative.    ED Results / Procedures / Treatments   Labs (all labs ordered are listed, but only abnormal results are displayed) Labs Reviewed  CBC WITH DIFFERENTIAL/PLATELET - Abnormal; Notable for the following components:      Result Value   RBC 5.33 (*)    Hemoglobin 16.0 (*)    HCT 47.4 (*)    All other components within normal limits  COMPREHENSIVE METABOLIC PANEL - Abnormal; Notable for the following components:   BUN 23 (*)    Calcium 8.6 (*)    AST 12 (*)    All other components within normal limits  URINALYSIS, ROUTINE W REFLEX MICROSCOPIC  I-STAT BETA HCG BLOOD, ED ([facility], [facility], [facility] ONLY)    EKG None  Radiology No results found.  Procedures Procedures   Medications Ordered in ED Medications - No data to display  ED Course  I have reviewed the triage vital signs and the nursing notes.  Pertinent labs & imaging results that were available during my care of the patient were reviewed by me and considered in my medical decision making (see chart for details).  Clinical Course as of 01/01/21 2124  Thu Jan 01, 2021  2034 Megesterol was on this and stopped taking in august. BID [GL]  2037  Megestrol Acetate 40 mg Oral 2 times daily  [GL]    Clinical Course User Index [GL] Zerah Hilyer, Adora Fridge, PA-C   MDM Rules/Calculators/A&P                          Patient presents with 2 days of  vaginal bleeding after not having a period for 6 years.  Her vitals are stable she is hypertensive to 188/91.  No evidence of endorgan damage or symptomatic from this. CBC with no anemia noted. Kidney function normal, electrolytes normal, LFTs normal, no leukocytosis.  Pregnancy negative.  Patient with bilateral suprapubic tenderness to palpation. Not peritonitic. Doubt ovarian torsion, ectopic pregnancy, PID.  Do not feel that she requires emergent ultrasound at this time.  Sounds like patient stopped taking her megestrol and is now having recurrence of abnormal uterine bleeding.  It does not sound like patient ever formally stopped having periods before she started taking the megestrol, so is unclear if she is postmenopausal or not at this time.  Will discharge home on 10 days of megestrol acetate at her original dose.  Referred her to women's health clinic at [facility] for reevaluation and possible ultrasound.  Final Clinical Impression(s) / ED Diagnoses Final diagnoses:  Abnormal uterine bleeding (AUB)    Rx / DC Orders ED Discharge Orders          Ordered    megestrol (MEGACE) 40 MG tablet  2 times daily        01/01/21 2047    traMADol (ULTRAM) 50 MG tablet  Every 6 hours PRN,   Status:  Discontinued        01/01/21  2050    traMADol (ULTRAM) 50 MG tablet  Every 6 hours PRN        01/01/21 2051             Sheila Oats 01/01/21 2124    Truddie Hidden, MD 01/01/21 2300

## 2021-01-02 ENCOUNTER — Telehealth: Payer: Self-pay | Admitting: Obstetrics and Gynecology

## 2021-01-02 ENCOUNTER — Telehealth: Payer: Self-pay

## 2021-01-02 NOTE — Telephone Encounter (Signed)
Patient is scheduled for 12-1, she need a RX for her medication to help control her bleeding.

## 2021-01-02 NOTE — Telephone Encounter (Signed)
Spoke with pt. Pt states was seen in ER for ongoing AUB. Pt was given Rx Megace and Rx Ultram for pain at ER. Pt is taking Rx Megace and bleeding is improving. Pt worried that only having 20 tablets for 10 day supply and with OV scheduled for 12/1 that she will begin to bleed more again. Pt advised to take Rx Megace as prescribed and to call back Monday or next week for earlier appt. Pt agreeable to plan of care.  Colletta Maryland, RN

## 2021-01-02 NOTE — Telephone Encounter (Signed)
Pt went to ED and they prescribed her some medication.

## 2021-01-02 NOTE — Telephone Encounter (Signed)
Pt called in stating she has been having on going issues with her period and the pain she is having and stating she would like for a nurse to reach out to her to see about getting something called in for her, please advise.

## 2021-01-16 ENCOUNTER — Ambulatory Visit: Payer: Self-pay | Admitting: *Deleted

## 2021-01-16 NOTE — Telephone Encounter (Signed)
Patient states she has history of heavy prolonged bleeding with anemia. Patient was given medication to stop her bleeding- but she ran out of Rx and did not have proper follow up. Patient was seen at ED for heavy bleeding and she was given medication to stop bleeding again- she does have GYN appointment- but she is out of her hormones and for 2 days she has had bleeding. It is not heavy at this time- but she feels it is going to be soon. Patient advised to follow up with GYN- but she states they will not fill the Rx. Patient advised she will need appointment in the office for evaluation and RF- Appointment scheduled for Monday- patient is aware to go to UC/ED if her bleeding becomes heavy over the week end.

## 2021-01-16 NOTE — Telephone Encounter (Signed)
Pt called saying she was in a medication for non stop periods and she is currently off the med and her bleeding has started back   CB#  [phone]  Reason for Disposition  Periods last > 7 days  Answer Assessment - Initial Assessment Questions 1. AMOUNT: "Describe the bleeding that you are having."    - SPOTTING: spotting, or pinkish / brownish mucous discharge; does not fill panty liner or pad    - MILD:  less than 1 pad / hour; less than patient's usual menstrual bleeding   - MODERATE: 1-2 pads / hour; 1 menstrual cup every 6 hours; small-medium blood clots (e.g., pea, grape, small coin)   - SEVERE: soaking 2 or more pads/hour for 2 or more hours; 1 menstrual cup every 2 hours; bleeding not contained by pads or continuous red blood from vagina; large blood clots (e.g., golf ball, large coin)      mild 2. ONSET: "When did the bleeding begin?" "Is it continuing now?"     11/11- patient was given medication to stop the bleeding- out of medication- 2 days and now bleeding 3. MENSTRUAL PERIOD: "When was the last normal menstrual period?" "How is this different than your period?"     It has been years 4. REGULARITY: "How regular are your periods?"     no 5. ABDOMINAL PAIN: "Do you have any pain?" "How bad is the pain?"  (e.g., Scale 1-10; mild, moderate, or severe)   - MILD (1-3): doesn't interfere with normal activities, abdomen soft and not tender to touch    - MODERATE (4-7): interferes with normal activities or awakens from sleep, abdomen tender to touch    - SEVERE (8-10): excruciating pain, doubled over, unable to do any normal activities      Yes- lower back and uterine pain, moderate 6. PREGNANCY: "Could you be pregnant?" "Are you sexually active?" "Did you recently give birth?"     No- not active 7. BREASTFEEDING: "Are you breastfeeding?"     no 8. HORMONES: "Are you taking any hormone medications, prescription or OTC?" (e.g., birth control pills, estrogen)     no 9. BLOOD  THINNERS: "Do you take any blood thinners?" (e.g., Coumadin/warfarin, Pradaxa/dabigatran, aspirin)     no 10. CAUSE: "What do you think is causing the bleeding?" (e.g., recent gyn surgery, recent gyn procedure; known bleeding disorder, cervical cancer, polycystic ovarian disease, fibroids)         Fibroids, AUB history 11. HEMODYNAMIC STATUS: "Are you weak or feeling lightheaded?" If Yes, ask: "Can you stand and walk normally?"        Light headed yesterday- not today 12. OTHER SYMPTOMS: "What other symptoms are you having with the bleeding?" (e.g., passed tissue, vaginal discharge, fever, menstrual-type cramps)       cramping  Protocols used: Vaginal Bleeding - Abnormal-A-AH

## 2021-01-19 ENCOUNTER — Encounter: Payer: Self-pay | Admitting: Physician Assistant

## 2021-01-19 ENCOUNTER — Ambulatory Visit: Payer: Self-pay | Attending: Physician Assistant | Admitting: Physician Assistant

## 2021-01-19 ENCOUNTER — Other Ambulatory Visit: Payer: Self-pay

## 2021-01-19 VITALS — BP 166/95 | HR 92 | Temp 98.3°F | Resp 18 | Ht 71.0 in | Wt 199.0 lb

## 2021-01-19 DIAGNOSIS — R030 Elevated blood-pressure reading, without diagnosis of hypertension: Secondary | ICD-10-CM

## 2021-01-19 DIAGNOSIS — N939 Abnormal uterine and vaginal bleeding, unspecified: Secondary | ICD-10-CM

## 2021-01-19 DIAGNOSIS — Z6827 Body mass index (BMI) 27.0-27.9, adult: Secondary | ICD-10-CM

## 2021-01-19 MED ORDER — MEGESTROL ACETATE 40 MG PO TABS
40.0000 mg | ORAL_TABLET | Freq: Two times a day (BID) | ORAL | 0 refills | Status: DC
  Filled 2021-01-19: qty 20, 10d supply, fill #0

## 2021-01-19 NOTE — Progress Notes (Signed)
Patient has eaten and taken pain medication today. Patient reports pain at a 6 currently in the back and abdomen. Patient request refill of iron supplement and megace.

## 2021-01-19 NOTE — Progress Notes (Signed)
Established Patient Office Visit  Subjective:  Patient ID: Kendra [name], female    DOB: 1970/06/03  Age: 50 y.o. MRN: [id number]  CC:  Chief Complaint  Patient presents with   Menstrual Problem     HPI [name] states that she was seen on January 01, 2021 at the emergency department for abnormal vaginal bleeding.  Hospital course:  Patient presents with 2 days of vaginal bleeding after not having a period for 6 years.  Her vitals are stable she is hypertensive to 188/91.  No evidence of endorgan damage or symptomatic from this. CBC with no anemia noted. Kidney function normal, electrolytes normal, LFTs normal, no leukocytosis.  Pregnancy negative.   Patient with bilateral suprapubic tenderness to palpation. Not peritonitic. Doubt ovarian torsion, ectopic pregnancy, PID.  Do not feel that she requires emergent ultrasound at this time.   Sounds like patient stopped taking her megestrol and is now having recurrence of abnormal uterine bleeding.  It does not sound like patient ever formally stopped having periods before she started taking the megestrol, so is unclear if she is postmenopausal or not at this time.  Will discharge home on 10 days of megestrol acetate at her original dose.  Referred her to women's health clinic at [facility] for reevaluation and possible ultrasound.  States today that she did take the megace prescribed for the 10 days and bleeding did improve but states that she has continued to have bleeding.  States that it is not as heavy, is not having any clotting, does endorse that she continues to feel fatigued.  States that she has a follow-up with gynecology on January 22, 2021.  Does state that she was previously on Megace for 6 years, states that she was unable to have follow-up visits and her medication was not refilled.  Reports that she does not check her blood pressure at home, denies any hypertensive symptoms.     Past Medical History:   Diagnosis Date   Anemia    Fibroid, uterine     Past Surgical History:  Procedure Laterality Date   DILATION AND CURETTAGE OF UTERUS  2004    Family History  Problem Relation Age of Onset   Hypertension Mother    Heart disease Father    Cancer Maternal Grandfather     Social History   Socioeconomic History   Marital status: Widowed    Spouse name: Not on file   Number of children: Not on file   Years of education: Not on file   Highest education level: Not on file  Occupational History   Not on file  Tobacco Use   Smoking status: Every Day    Packs/day: 1.00    Years: 30.00    Pack years: 30.00    Types: Cigarettes   Smokeless tobacco: Never  Vaping Use   Vaping Use: Never used  Substance and Sexual Activity   Alcohol use: No    Alcohol/week: 0.0 standard drinks   Drug use: No   Sexual activity: Not Currently  Other Topics Concern   Not on file  Social History Narrative   Not on file   Social Determinants of Health   Financial Resource Strain: Not on file  Food Insecurity: Not on file  Transportation Needs: Not on file  Physical Activity: Not on file  Stress: Not on file  Social Connections: Not on file  Intimate Partner Violence: Not on file    Outpatient Medications Prior to  Visit  Medication Sig Dispense Refill   ferrous sulfate (FERROUSUL) 325 (65 FE) MG tablet Take 1 tablet (325 mg total) by mouth daily with breakfast. 30 tablet 3   hydrOXYzine (ATARAX/VISTARIL) 10 MG tablet Take 1 tablet (10 mg total) by mouth 3 (three) times daily as needed for anxiety. 30 tablet 3   traMADol (ULTRAM) 50 MG tablet Take 1 tablet (50 mg total) by mouth every 6 (six) hours as needed for up to 10 doses. 10 tablet 0   No facility-administered medications prior to visit.    Allergies  Allergen Reactions   Hydrocodone    Tramadol Nausea Only    ROS Review of Systems  Constitutional:  Positive for fatigue. Negative for chills and fever.  HENT: Negative.     Eyes: Negative.   Respiratory:  Negative for shortness of breath.   Cardiovascular:  Negative for chest pain.  Gastrointestinal:  Negative for abdominal pain.  Endocrine: Negative.   Genitourinary:  Positive for menstrual problem and vaginal bleeding. Negative for vaginal discharge.  Musculoskeletal: Negative.   Skin: Negative.   Allergic/Immunologic: Negative.   Neurological: Negative.   Hematological: Negative.   Psychiatric/Behavioral: Negative.       Objective:    Physical Exam Vitals and nursing note reviewed.  Constitutional:      General: She is not in acute distress.    Appearance: Normal appearance. She is not ill-appearing.  HENT:     Head: Normocephalic and atraumatic.     Right Ear: External ear normal.     Left Ear: External ear normal.     Nose: Nose normal.     Mouth/Throat:     Mouth: Mucous membranes are moist.     Pharynx: Oropharynx is clear.  Eyes:     Extraocular Movements: Extraocular movements intact.     Conjunctiva/sclera: Conjunctivae normal.     Pupils: Pupils are equal, round, and reactive to light.  Cardiovascular:     Rate and Rhythm: Normal rate and regular rhythm.     Pulses: Normal pulses.     Heart sounds: Normal heart sounds.  Pulmonary:     Effort: Pulmonary effort is normal.     Breath sounds: Normal breath sounds.  Musculoskeletal:        General: Normal range of motion.     Cervical back: Normal range of motion and neck supple.  Skin:    General: Skin is warm and dry.  Neurological:     General: No focal deficit present.     Mental Status: She is alert and oriented to person, place, and time.  Psychiatric:        Mood and Affect: Mood normal.        Behavior: Behavior normal.        Thought Content: Thought content normal.        Judgment: Judgment normal.    BP (!) 166/95 (BP Location: Left Arm, Patient Position: Sitting, Cuff Size: Large)   Pulse 92   Temp 98.3 F (36.8 C) (Oral)   Resp 18   Ht 5\' 11"  (1.803 m)    Wt 199 lb (90.3 kg)   LMP 06/09/2014   SpO2 100%   BMI 27.75 kg/m  Wt Readings from Last 3 Encounters:  01/19/21 199 lb (90.3 kg)  03/08/19 149 lb (67.6 kg)  07/31/14 164 lb 9.6 oz (74.7 kg)     Health Maintenance Due  Topic Date Due   COVID-19 Vaccine (1) Never done   Pneumococcal Vaccine  41-31 Years old (1 - PCV) Never done   HIV Screening  Never done   Hepatitis C Screening  Never done   TETANUS/TDAP  Never done   COLONOSCOPY (Pts 45-23yrs Insurance coverage will need to be confirmed)  Never done   PAP SMEAR-Modifier  06/25/2017   INFLUENZA VACCINE  Never done    There are no preventive care reminders to display for this patient.  Lab Results  Component Value Date   TSH 0.742 05/11/2019   Lab Results  Component Value Date   WBC 6.0 01/01/2021   HGB 16.0 (H) 01/01/2021   HCT 47.4 (H) 01/01/2021   MCV 88.9 01/01/2021   PLT 228 01/01/2021   Lab Results  Component Value Date   NA 135 01/01/2021   K 3.6 01/01/2021   CO2 25 01/01/2021   GLUCOSE 99 01/01/2021   BUN 23 (H) 01/01/2021   CREATININE 0.49 01/01/2021   BILITOT 0.3 01/01/2021   ALKPHOS 76 01/01/2021   AST 12 (L) 01/01/2021   ALT 20 01/01/2021   PROT 7.2 01/01/2021   ALBUMIN 4.1 01/01/2021   CALCIUM 8.6 (L) 01/01/2021   ANIONGAP 6 01/01/2021   Lab Results  Component Value Date   CHOL 150 05/11/2019   Lab Results  Component Value Date   HDL 37 (L) 05/11/2019   Lab Results  Component Value Date   LDLCALC 88 05/11/2019   Lab Results  Component Value Date   TRIG 140 05/11/2019   Lab Results  Component Value Date   CHOLHDL 4.1 05/11/2019   Lab Results  Component Value Date   HGBA1C 5.2 05/11/2019      Assessment & Plan:   Problem List Items Addressed This Visit       Genitourinary   Abnormal uterine bleeding (AUB) - Primary   Relevant Medications   megestrol (MEGACE) 40 MG tablet     Other   Elevated blood pressure reading in office without diagnosis of hypertension   BMI  27.0-27.9,adult    Meds ordered this encounter  Medications   megestrol (MEGACE) 40 MG tablet    Sig: Take 1 tablet (40 mg total) by mouth 2 (two) times daily.    Dispense:  20 tablet    Refill:  0    Order Specific Question:   Supervising Provider    Answer:   WRIGHT, PATRICK E [[id number]]  1. Abnormal uterine bleeding (AUB) Resume Megace as previously prescribed.  Patient to follow-up with gynecology for further evaluation on January 22, 2021.  Red flags given for prompt reevaluation - megestrol (MEGACE) 40 MG tablet; Take 1 tablet (40 mg total) by mouth 2 (two) times daily.  Dispense: 20 tablet; Refill: 0  2. Elevated blood pressure reading in office without diagnosis of hypertension Patient encouraged to check blood pressure at home on a daily basis, keep a written log and have available for all office visits.  Patient strongly encouraged to follow-up with primary care provider if blood pressure readings continue to be elevated.  Red flags given for prompt reevaluation.  Patient understands and agrees.  3. BMI 27.0-27.9,adult    I have reviewed the patient's medical history (PMH, PSH, Social History, Family History, Medications, and allergies) , and have been updated if relevant. I spent 30 minutes reviewing chart and  face to face time with patient.     Follow-up: Return if symptoms worsen or fail to improve.    Loraine Grip Mayers, PA-C

## 2021-01-19 NOTE — Patient Instructions (Signed)
I sent a refill of the Megace to your pharmacy.  I strongly encourage you to keep your follow-up with gynecology.  I strongly encourage you to check your blood pressure at home on a daily basis, keep a written log and have available for all office visits.  Please return to clinic if you are experiencing elevated blood pressure readings.  Kennieth Rad, PA-C Physician Assistant [place] [email or web address]   How to Take Your Blood Pressure Blood pressure is a measurement of how strongly your blood is pressing against the walls of your arteries. Arteries are blood vessels that carry blood from your heart throughout your body. Your health care provider takes your blood pressure at each office visit. You can also take your own blood pressure at home with a blood pressure monitor. You may need to take your own blood pressure to: Confirm a diagnosis of high blood pressure (hypertension). Monitor your blood pressure over time. Make sure your blood pressure medicine is working. Supplies needed: Blood pressure monitor. Dining room chair to sit in. Table or desk. Small notebook and pencil or pen. How to prepare To get the most accurate reading, avoid the following for 30 minutes before you check your blood pressure: Drinking caffeine. Drinking alcohol. Eating. Smoking. Exercising. Five minutes before you check your blood pressure: Use the bathroom and urinate so that you have an empty bladder. Sit quietly in a dining room chair. Do not sit in a soft couch or an armchair. Do not talk. How to take your blood pressure To check your blood pressure, follow the instructions in the manual that came with your blood pressure monitor. If you have a digital blood pressure monitor, the instructions may be as follows: Sit up straight in a chair. Place your feet on the floor. Do not cross your ankles or legs. Rest your left arm at the level of  your heart on a table or desk or on the arm of a chair. Pull up your shirt sleeve. Wrap the blood pressure cuff around the upper part of your left arm, 1 inch (2.5 cm) above your elbow. It is best to wrap the cuff around bare skin. Fit the cuff snugly around your arm. You should be able to place only one finger between the cuff and your arm. Position the cord so that it rests in the bend of your elbow. Press the power button. Sit quietly while the cuff inflates and deflates. Read the digital reading on the monitor screen and write the numbers down (record them) in a notebook. Wait 2-3 minutes, then repeat the steps, starting at step 1. What does my blood pressure reading mean? A blood pressure reading consists of a higher number over a lower number. Ideally, your blood pressure should be below 120/80. The first ("top") number is called the systolic pressure. It is a measure of the pressure in your arteries as your heart beats. The second ("bottom") number is called the diastolic pressure. It is a measure of the pressure in your arteries as the heart relaxes. Blood pressure is classified into five stages. The following are the stages for adults who do not have a short-term serious illness or a chronic condition. Systolic pressure and diastolic pressure are measured in a unit called mm Hg (millimeters of mercury).  Normal Systolic pressure: below 622. Diastolic pressure: below 80. Elevated Systolic pressure: 297-989. Diastolic pressure: below 80. Hypertension stage 1 Systolic pressure: 211-941. Diastolic pressure: 74-08. Hypertension stage 2 Systolic pressure: 144 or  above. Diastolic pressure: 90 or above. You can have elevated blood pressure or hypertension even if only the systolic or only the diastolic number in your reading is higher than normal. Follow these instructions at home: Medicines Take over-the-counter and prescription medicines only as told by your health care provider. Tell  your health care provider if you are having any side effects from blood pressure medicine. General instructions Check your blood pressure as often as recommended by your health care provider. Check your blood pressure at the same time every day. Take your monitor to the next appointment with your health care provider to make sure that: You are using it correctly. It provides accurate readings. Understand what your goal blood pressure numbers are. Keep all follow-up visits as told by your health care provider. This is important. General tips Your health care provider can suggest a reliable monitor that will meet your needs. There are several types of home blood pressure monitors. Choose a monitor that has an arm cuff. Do not choose a monitor that measures your blood pressure from your wrist or finger. Choose a cuff that wraps snugly around your upper arm. You should be able to fit only one finger between your arm and the cuff. You can buy a blood pressure monitor at most drugstores or online. Where to find more information American Heart Association: www.heart.org Contact a health care provider if: Your blood pressure is consistently high. Your blood pressure is suddenly low. Get help right away if: Your systolic blood pressure is higher than 180. Your diastolic blood pressure is higher than 120. Summary Blood pressure is a measurement of how strongly your blood is pressing against the walls of your arteries. A blood pressure reading consists of a higher number over a lower number. Ideally, your blood pressure should be below 120/80. Check your blood pressure at the same time every day. Avoid caffeine, alcohol, smoking, and exercise for 30 minutes prior to checking your blood pressure. These agents can affect the accuracy of the blood pressure reading. This information is not intended to replace advice given to you by your health care provider. Make sure you discuss any questions you have  with your health care provider. Document Revised: 12/19/2019 Document Reviewed: 02/02/2019 Elsevier Patient Education  2022 [employer].

## 2021-01-20 DIAGNOSIS — Z6827 Body mass index (BMI) 27.0-27.9, adult: Secondary | ICD-10-CM | POA: Insufficient documentation

## 2021-01-20 DIAGNOSIS — R030 Elevated blood-pressure reading, without diagnosis of hypertension: Secondary | ICD-10-CM | POA: Insufficient documentation

## 2021-01-22 ENCOUNTER — Other Ambulatory Visit: Payer: Self-pay

## 2021-01-22 ENCOUNTER — Other Ambulatory Visit (HOSPITAL_COMMUNITY)
Admission: RE | Admit: 2021-01-22 | Discharge: 2021-01-22 | Disposition: A | Discharge status: Home / Self Care | Payer: Medicaid Other | Source: Ambulatory Visit | Attending: Obstetrics and Gynecology | Admitting: Obstetrics and Gynecology

## 2021-01-22 ENCOUNTER — Ambulatory Visit (INDEPENDENT_AMBULATORY_CARE_PROVIDER_SITE_OTHER): Payer: Self-pay | Admitting: Obstetrics and Gynecology

## 2021-01-22 ENCOUNTER — Encounter: Payer: Self-pay | Admitting: Obstetrics and Gynecology

## 2021-01-22 VITALS — BP 155/91 | HR 74 | Ht 71.0 in | Wt 198.7 lb

## 2021-01-22 DIAGNOSIS — Z124 Encounter for screening for malignant neoplasm of cervix: Secondary | ICD-10-CM | POA: Insufficient documentation

## 2021-01-22 DIAGNOSIS — D219 Benign neoplasm of connective and other soft tissue, unspecified: Secondary | ICD-10-CM

## 2021-01-22 DIAGNOSIS — N939 Abnormal uterine and vaginal bleeding, unspecified: Secondary | ICD-10-CM

## 2021-01-22 MED ORDER — NORETHINDRONE ACETATE 5 MG PO TABS
5.0000 mg | ORAL_TABLET | Freq: Every day | ORAL | 2 refills | Status: DC
  Filled 2021-01-22: qty 30, 30d supply, fill #0

## 2021-01-22 NOTE — Patient Instructions (Signed)
Uterine Fibroids Uterine fibroids are lumps of tissue (tumors) in the womb (uterus). Fibroids are not cancerous. Most women with this condition do not need treatment. Sometimes, fibroids can make it harder to have children. If this happens, you may need surgery to take out the fibroids. What are the causes? The cause of this condition is not known. What increases the risk? You are in your 30s or 40s and have not gone through menopause. Menopause is when you have not had a menstrual period for 12 months. Having a history of fibroids in your family. You are of African American descent. You started your period at age 45 or younger. You have not given birth. You are overweight or very overweight. What are the signs or symptoms? Bleeding between menstrual periods. Heavy bleeding during your menstrual period. Pain in the area between your hips. Needing to pee (urinate) right away or more often than usual. Not being able to have children (infertility). Not being able to stay pregnant (miscarriage). Many women do not have symptoms.  How is this treated? Treatment may include: Follow-up visits with your doctor to check your fibroids for any changes. Medicines to help with pain, such as aspirin or ibuprofen. Hormone therapy. This may be given as a pill, in a shot, or with a type of birth control device called an IUD. Surgery that would do one of these things: Take out the fibroids. This may be done if you want to become pregnant. Take out the womb (hysterectomy). Stop the blood flow to the fibroids. Follow these instructions at home: Medicines Take over-the-counter and prescription medicines only as told by your doctor. Ask your doctor if you should: Take iron pills. Eat more foods that have a lot of iron in them, such as dark green, leafy vegetables. Managing pain If told, put heat on your back or belly. Do this as often as told by your doctor. Use the heat source that your doctor  recommends, such as a moist heat pack or a heating pad. To do this: Put a towel between your skin and the heat pack or pad. Leave the heat on for 20-30 minutes. Take off the heat if your skin turns bright red. This is very important. If you cannot feel pain, heat, or cold, you may have a greater risk of getting burned.  General instructions Tell your doctor about any changes to your menstrual period, such as: Heavy bleeding that needs a change of tampons or pads more than normal. A change in how many days your period lasts. A change in symptoms that come with your period. This might be belly cramps or back pain. Keep all follow-up visits. Contact a doctor if: You have pain that does not get better with medicine or heat. This may include pain or cramps in: The area between your hip bones. Your back. Your belly. You have new bleeding between your periods. You have more bleeding during or between your periods. You feel very tired or weak. You feel dizzy. Get help right away if: You faint. You have pain in the area between your hip bones that gets worse. You have bleeding that soaks a tampon or pad in 30 minutes or less. Summary Uterine fibroids are lumps of tissue (tumors) in your womb. They are not cancerous. Medicines such as aspirin or ibuprofen may be used to help with pain. Contact a doctor if you have pain or cramps that do not get better with medicine. Know the symptoms for when you should  get help right away. This information is not intended to replace advice given to you by your health care provider. Make sure you discuss any questions you have with your health care provider. Document Revised: 09/11/2019 Document Reviewed: 09/11/2019 Elsevier Patient Education  [place].

## 2021-01-22 NOTE — Progress Notes (Signed)
cy

## 2021-01-22 NOTE — Progress Notes (Signed)
Kendra [name] presents for F/U for heavy cycles. Known problems for pt. Was scheduled for [facility] in 2016, canceled as daily Megace controlled bleeding. However she ran out and present to ER with bleeding. Was restarted on Megace, and this was continued by her PCP. She having some bleeding today.  Known uterine fibroids from GYN U/S 5/16.  Pap smear due  TSVD x 1, 8# 10 oz  PE AF VSS  Chaperone present during exam   Lungs clear Heart RRR Abd soft + BS GU nl EGBUS, scant menses noted, pap smear collected, uterus < 10 weeks mobile, non tender  A/P DUB        Uterine fibroids.  Will switch from Megace to Aygestin. Check GYN U/S. F/U in 3-4 weeks to discuss results and Tx options. Pt is not interested in surgery if at all possible. Manage pap smear as per results

## 2021-01-27 LAB — CYTOLOGY - PAP
Comment: NEGATIVE
Diagnosis: NEGATIVE
Diagnosis: REACTIVE
High risk HPV: NEGATIVE

## 2021-01-28 ENCOUNTER — Ambulatory Visit (HOSPITAL_COMMUNITY)
Admission: RE | Admit: 2021-01-28 | Discharge: 2021-01-28 | Disposition: A | Discharge status: Home / Self Care | Payer: Self-pay | Source: Ambulatory Visit | Attending: Obstetrics and Gynecology | Admitting: Obstetrics and Gynecology

## 2021-01-28 ENCOUNTER — Other Ambulatory Visit: Payer: Self-pay

## 2021-01-28 DIAGNOSIS — D219 Benign neoplasm of connective and other soft tissue, unspecified: Secondary | ICD-10-CM | POA: Insufficient documentation

## 2021-02-09 ENCOUNTER — Telehealth: Payer: Self-pay | Admitting: General Practice

## 2021-02-09 ENCOUNTER — Other Ambulatory Visit: Payer: Self-pay

## 2021-02-09 DIAGNOSIS — N939 Abnormal uterine and vaginal bleeding, unspecified: Secondary | ICD-10-CM

## 2021-02-09 MED ORDER — MEGESTROL ACETATE 40 MG PO TABS
40.0000 mg | ORAL_TABLET | Freq: Two times a day (BID) | ORAL | 2 refills | Status: DC
  Filled 2021-02-09: qty 60, 30d supply, fill #0

## 2021-02-09 NOTE — Telephone Encounter (Signed)
Patient called into front office stating she has been bleeding for months and the medication we gave her isn't helping. Patient states the bleeding isn't super heavy, it just hasn't stopped in months. She reports previously being on megace which did help. Per Dr Rip Harbour, patient can have megace 40mg  BID. Informed patient. Patient verbalized understanding.

## 2021-02-26 ENCOUNTER — Ambulatory Visit: Payer: Self-pay | Admitting: Obstetrics and Gynecology

## 2021-03-02 ENCOUNTER — Other Ambulatory Visit: Payer: Self-pay

## 2021-03-02 ENCOUNTER — Encounter: Payer: Self-pay | Admitting: Obstetrics and Gynecology

## 2021-03-02 ENCOUNTER — Ambulatory Visit (INDEPENDENT_AMBULATORY_CARE_PROVIDER_SITE_OTHER): Payer: Self-pay | Admitting: Obstetrics and Gynecology

## 2021-03-02 VITALS — BP 175/96 | HR 83 | Ht 71.0 in | Wt 203.6 lb

## 2021-03-02 DIAGNOSIS — N939 Abnormal uterine and vaginal bleeding, unspecified: Secondary | ICD-10-CM

## 2021-03-02 DIAGNOSIS — D219 Benign neoplasm of connective and other soft tissue, unspecified: Secondary | ICD-10-CM

## 2021-03-02 DIAGNOSIS — I10 Essential (primary) hypertension: Secondary | ICD-10-CM

## 2021-03-02 MED ORDER — MEGESTROL ACETATE 40 MG PO TABS: 40.0000 mg | ORAL_TABLET | Freq: Two times a day (BID) | ORAL | 5 refills | Status: AC

## 2021-03-02 NOTE — Patient Instructions (Signed)

## 2021-03-02 NOTE — Progress Notes (Signed)
Ms Sherrard presents for follow from her medication and Gyn U/S for DUB and uterine fibroids. GYN U/S essentially unchanged from prior U/S.  Bleeding has stopped with Bid Megace. Tolerating well.  PE AF  BP 175/96 Lungs clear Heart RRR Abd soft + BS   A/P  AUB   Uterine fibroids  Hypertension  GYN U/S reviewed with pt. Will continue with Bid Megace. Rx with refills provided. Pt advised to see PCP for elevated BP. F/U with me in 6 months.

## 2021-03-12 ENCOUNTER — Ambulatory Visit: Payer: Self-pay | Admitting: Obstetrics and Gynecology

## 2021-07-23 ENCOUNTER — Ambulatory Visit (INDEPENDENT_AMBULATORY_CARE_PROVIDER_SITE_OTHER): Payer: Self-pay | Admitting: Obstetrics and Gynecology

## 2021-07-23 ENCOUNTER — Encounter: Payer: Self-pay | Admitting: Obstetrics and Gynecology

## 2021-07-23 VITALS — BP 152/87 | HR 91 | Wt 204.5 lb

## 2021-07-23 DIAGNOSIS — Z1231 Encounter for screening mammogram for malignant neoplasm of breast: Secondary | ICD-10-CM | POA: Insufficient documentation

## 2021-07-23 DIAGNOSIS — D219 Benign neoplasm of connective and other soft tissue, unspecified: Secondary | ICD-10-CM

## 2021-07-23 DIAGNOSIS — N939 Abnormal uterine and vaginal bleeding, unspecified: Secondary | ICD-10-CM

## 2021-07-23 MED ORDER — MEGESTROL ACETATE 40 MG PO TABS
ORAL_TABLET | ORAL | 5 refills | Status: DC
Start: 2021-07-23 — End: 2022-02-25

## 2021-07-23 NOTE — Progress Notes (Signed)
Mammogram scholarship faxed. Pt informed that someone will call her with an appt if she is approved.  Pt verbalized understanding.  Frances Nickels  07/23/21

## 2021-07-26 NOTE — Progress Notes (Signed)
Ms Kendra [name] presents for follow up for DUB and uterine fibroids. Has been taking Megace bid. Had been doing well until the last 2 months. Has had mote BTB despite the Megace.  Still declines surgery, as unable to take time off.  PE AF VSS Lungs clear Heart RRR Abd soft + BS  A/P Uterine fibroids        DUB  Will increase Megace to tid till bleeding stops and then return to bid. Mammogram order provided. Will f/u in 2 months

## 2021-08-19 ENCOUNTER — Ambulatory Visit: Payer: Medicaid Other | Admitting: Obstetrics and Gynecology

## 2021-10-01 ENCOUNTER — Ambulatory Visit (INDEPENDENT_AMBULATORY_CARE_PROVIDER_SITE_OTHER): Payer: Self-pay | Admitting: Obstetrics and Gynecology

## 2021-10-01 ENCOUNTER — Encounter: Payer: Self-pay | Admitting: Obstetrics and Gynecology

## 2021-10-01 VITALS — BP 160/100 | HR 77 | Ht 71.0 in | Wt 204.5 lb

## 2021-10-01 DIAGNOSIS — D219 Benign neoplasm of connective and other soft tissue, unspecified: Secondary | ICD-10-CM

## 2021-10-01 DIAGNOSIS — N939 Abnormal uterine and vaginal bleeding, unspecified: Secondary | ICD-10-CM

## 2021-10-01 MED ORDER — MEGESTROL ACETATE 40 MG PO TABS: 40.0000 mg | ORAL_TABLET | Freq: Three times a day (TID) | ORAL | 1 refills | Status: DC

## 2021-10-01 NOTE — Progress Notes (Signed)
Ms Kendra [name] presents for f/u for AUB Bleeding resolved with increased in Megace Now back to BID Megace and no bleeding  PE AF     BP 168/100 Lungs clear Heart RRR Abd soft + BS  A/P DUB        Uterine fibroids  Will continue with Megace bid. Rx for TID Megace given if needed in the future. Still declines surgery. To see PCP for BP eval F/U in 6 months

## 2021-10-07 ENCOUNTER — Other Ambulatory Visit: Payer: Self-pay | Admitting: Pharmacist

## 2021-10-07 DIAGNOSIS — N939 Abnormal uterine and vaginal bleeding, unspecified: Secondary | ICD-10-CM

## 2021-10-07 MED ORDER — MEGESTROL ACETATE 40 MG PO TABS: 40.0000 mg | ORAL_TABLET | Freq: Three times a day (TID) | ORAL | 1 refills | Status: DC

## 2021-10-07 NOTE — Progress Notes (Signed)
Patient called to let us know her updated prescription for Megace was never received by her pharmacy. Verified pharmacy on file and called to verify they had not received Rx. Prescription for megace '40mg'$  TID resent to [facility] in [place].  Thank you for allowing pharmacy to be a part of this patient's care.  Lolita Rieger, PharmD, BCPPS

## 2021-10-30 ENCOUNTER — Telehealth: Payer: Self-pay

## 2021-10-30 NOTE — Telephone Encounter (Signed)
Telephoned patient at mobile number. Left a voice message with [facility] (scholarship) contact information. 

## 2021-12-07 ENCOUNTER — Telehealth: Payer: Self-pay | Admitting: Family Medicine

## 2021-12-07 NOTE — Telephone Encounter (Signed)
Called [facility] and verified that pt has refill available for Megace. I asked them to prepare for pt.  I then called pt and left VM message stating that she can pick up her refill later this evening.

## 2021-12-07 NOTE — Telephone Encounter (Signed)
Patient requesting a RX for Megace .   [place]

## 2022-02-24 NOTE — Telephone Encounter (Signed)
Attempted to call patient. Telephone answered and disconnected. [facility] (scholarship)unable to leave a message.

## 2022-02-25 ENCOUNTER — Other Ambulatory Visit: Payer: Self-pay | Admitting: Obstetrics and Gynecology

## 2022-02-25 DIAGNOSIS — N939 Abnormal uterine and vaginal bleeding, unspecified: Secondary | ICD-10-CM

## 2022-02-25 DIAGNOSIS — D219 Benign neoplasm of connective and other soft tissue, unspecified: Secondary | ICD-10-CM

## 2022-02-25 MED ORDER — MEGESTROL ACETATE 40 MG PO TABS: ORAL_TABLET | ORAL | 1 refills | Status: DC

## 2022-02-25 NOTE — Telephone Encounter (Signed)
Patient is requesting a refill on Megace she scheduled with Rip Harbour in February

## 2022-02-25 NOTE — Telephone Encounter (Signed)
Refill approved by Rip Harbour. Rx sent.

## 2022-04-05 ENCOUNTER — Other Ambulatory Visit: Payer: Self-pay | Admitting: Obstetrics and Gynecology

## 2022-04-05 DIAGNOSIS — D219 Benign neoplasm of connective and other soft tissue, unspecified: Secondary | ICD-10-CM

## 2022-04-05 DIAGNOSIS — N939 Abnormal uterine and vaginal bleeding, unspecified: Secondary | ICD-10-CM

## 2022-04-08 ENCOUNTER — Other Ambulatory Visit: Payer: Self-pay | Admitting: Obstetrics and Gynecology

## 2022-04-08 DIAGNOSIS — N939 Abnormal uterine and vaginal bleeding, unspecified: Secondary | ICD-10-CM

## 2022-04-08 DIAGNOSIS — D219 Benign neoplasm of connective and other soft tissue, unspecified: Secondary | ICD-10-CM

## 2022-04-16 ENCOUNTER — Ambulatory Visit (INDEPENDENT_AMBULATORY_CARE_PROVIDER_SITE_OTHER): Payer: Self-pay | Admitting: Obstetrics and Gynecology

## 2022-04-16 ENCOUNTER — Encounter: Payer: Self-pay | Admitting: Obstetrics and Gynecology

## 2022-04-16 ENCOUNTER — Telehealth: Payer: Self-pay

## 2022-04-16 VITALS — BP 132/79 | HR 91 | Ht 71.0 in | Wt 199.7 lb

## 2022-04-16 DIAGNOSIS — N939 Abnormal uterine and vaginal bleeding, unspecified: Secondary | ICD-10-CM

## 2022-04-16 DIAGNOSIS — D219 Benign neoplasm of connective and other soft tissue, unspecified: Secondary | ICD-10-CM

## 2022-04-16 DIAGNOSIS — Z1231 Encounter for screening mammogram for malignant neoplasm of breast: Secondary | ICD-10-CM

## 2022-04-16 MED ORDER — MEGESTROL ACETATE 40 MG PO TABS: ORAL_TABLET | ORAL | 5 refills | Status: DC

## 2022-04-16 NOTE — Telephone Encounter (Signed)
Telephoned patient and left a voice message with [facility] contact information.

## 2022-04-16 NOTE — Progress Notes (Signed)
Ms Kendra [name] presents for f/u of her AUB and uterine fibroids Taking Megace bid and has no bleeding Tried to reduce to once a day but had some bleeding  Needs mammogram Pap smear 12/22  PE AF VSS Lungs clear Heart RRR Abd soft + BS  A/P AUB       Uterine fibroids       Screening mammogram  Continue with Megace bid. Mammogram ordered. F/U in 1 yr or PRN

## 2022-07-28 ENCOUNTER — Other Ambulatory Visit: Payer: Self-pay | Admitting: Obstetrics and Gynecology

## 2022-07-28 DIAGNOSIS — N939 Abnormal uterine and vaginal bleeding, unspecified: Secondary | ICD-10-CM

## 2022-07-28 DIAGNOSIS — D219 Benign neoplasm of connective and other soft tissue, unspecified: Secondary | ICD-10-CM

## 2022-08-03 ENCOUNTER — Telehealth: Payer: Self-pay

## 2022-08-03 NOTE — Telephone Encounter (Signed)
VM left requesting refill of medication.

## 2022-08-04 ENCOUNTER — Telehealth: Payer: Self-pay | Admitting: Family Medicine

## 2022-08-04 NOTE — Telephone Encounter (Signed)
Patient needs medication megestrol asap would like a call back, pharmacy will not refill it

## 2022-08-06 NOTE — Telephone Encounter (Signed)
Pt's concerns were addressed in a different encounter

## 2022-08-06 NOTE — Telephone Encounter (Signed)
Called and spoke with [place] @ [facility] [phone]. He stated that pt's insurance does not cover the medication. The cost would be $63.26 out of pocket with a coupon they can offer. Pt is also free to present any other coupons. I called pt and provided the information as well as she can have prescription transferred to another pharmacy if it would be less expensive for her. She voiced understanding and expressed gratitude.

## 2023-05-10 ENCOUNTER — Telehealth: Payer: Self-pay | Admitting: Lactation Services

## 2023-05-10 DIAGNOSIS — N939 Abnormal uterine and vaginal bleeding, unspecified: Secondary | ICD-10-CM

## 2023-05-10 DIAGNOSIS — D219 Benign neoplasm of connective and other soft tissue, unspecified: Secondary | ICD-10-CM

## 2023-05-10 MED ORDER — MEGESTROL ACETATE 40 MG PO TABS: ORAL_TABLET | ORAL | 0 refills | Status: DC

## 2023-05-10 NOTE — Telephone Encounter (Signed)
 Received request for refill of Megace from Pharmacy.   Called and spoke with patient, Patient reports no bleeding. She has not pain. She reports she has no bleeding as long as she is taking the medication.   Patient is aware she needs an annual exam. Will send message to front office to schedule.   She was informed Dr. Alysia Penna is no longer at this practice.   Spoke with Dr. Crissie Reese, Megace ordered with stipulation that further refills based on follow up appointment.

## 2023-06-28 ENCOUNTER — Ambulatory Visit (INDEPENDENT_AMBULATORY_CARE_PROVIDER_SITE_OTHER): Admitting: Obstetrics and Gynecology

## 2023-06-28 ENCOUNTER — Encounter: Payer: Self-pay | Admitting: Obstetrics and Gynecology

## 2023-06-28 VITALS — BP 172/106 | HR 92 | Ht 71.0 in | Wt 208.0 lb

## 2023-06-28 DIAGNOSIS — Z0000 Encounter for general adult medical examination without abnormal findings: Secondary | ICD-10-CM

## 2023-06-28 DIAGNOSIS — Z01419 Encounter for gynecological examination (general) (routine) without abnormal findings: Secondary | ICD-10-CM | POA: Diagnosis not present

## 2023-06-28 DIAGNOSIS — R232 Flushing: Secondary | ICD-10-CM

## 2023-06-28 DIAGNOSIS — R399 Unspecified symptoms and signs involving the genitourinary system: Secondary | ICD-10-CM

## 2023-06-28 DIAGNOSIS — R030 Elevated blood-pressure reading, without diagnosis of hypertension: Secondary | ICD-10-CM

## 2023-06-28 DIAGNOSIS — D219 Benign neoplasm of connective and other soft tissue, unspecified: Secondary | ICD-10-CM

## 2023-06-28 DIAGNOSIS — N939 Abnormal uterine and vaginal bleeding, unspecified: Secondary | ICD-10-CM

## 2023-06-28 DIAGNOSIS — Z1231 Encounter for screening mammogram for malignant neoplasm of breast: Secondary | ICD-10-CM

## 2023-06-28 MED ORDER — MEGESTROL ACETATE 40 MG PO TABS: ORAL_TABLET | ORAL | 0 refills | Status: DC

## 2023-06-28 MED ORDER — IBUPROFEN 800 MG PO TABS: 800.0000 mg | ORAL_TABLET | Freq: Three times a day (TID) | ORAL | 1 refills | Status: AC | PRN

## 2023-06-28 NOTE — Progress Notes (Unsigned)
 GYNECOLOGY ANNUAL PREVENTATIVE CARE ENCOUNTER NOTE  Subjective:   Kendra [name] is a 53 y.o. G61P1011 female here for a annual gynecologic exam. Current complaints: needs megace  refill.  On megace  40 mg BID for bleeding. Has been on it for 9 years without issue, whenever she tries to drop down to one tab per day, she starts bleeding again. Does not bleed at all with 2 tablets. Wondering when she may start perimenopause.  Denies abnormal vaginal bleeding, discharge, pelvic pain, problems with intercourse or other gynecologic concerns. Declines STI screen.   Gynecologic History No LMP recorded. Contraception: abstinence Last Pap: 01/2021. Results: normal Last mammogram: never had DEXA: has never had  Obstetric History OB History  Gravida Para Term Preterm AB Living  2 1 1  1 1   SAB IAB Ectopic Multiple Live Births  1    1    # Outcome Date GA Lbr Len/2nd Weight Sex Type Anes PTL Lv  2 SAB 2014          1 Term 07/02/96    M Vag-Spont   LIV    Past Medical History:  Diagnosis Date   Anemia    Fibroid, uterine     Past Surgical History:  Procedure Laterality Date   DILATION AND CURETTAGE OF UTERUS  2004    Current Outpatient Medications on File Prior to Visit  Medication Sig Dispense Refill   ferrous sulfate  (FERROUSUL) 325 (65 FE) MG tablet Take 1 tablet (325 mg total) by mouth daily with breakfast. 30 tablet 3   IBUPROFEN & ACETAMINOPHEN  PO Take by mouth.     No current facility-administered medications on file prior to visit.    Allergies  Allergen Reactions   Hydrocodone    Tramadol  Nausea Only    Social History   Socioeconomic History   Marital status: Widowed    Spouse name: Not on file   Number of children: Not on file   Years of education: Not on file   Highest education level: Not on file  Occupational History   Not on file  Tobacco Use   Smoking status: Every Day    Current packs/day: 1.00    Average packs/day: 1 pack/day for 30.0 years (30.0  ttl pk-yrs)    Types: Cigarettes   Smokeless tobacco: Never  Vaping Use   Vaping status: Never Used  Substance and Sexual Activity   Alcohol use: No    Alcohol/week: 0.0 standard drinks of alcohol   Drug use: No   Sexual activity: Not Currently    Birth control/protection: None  Other Topics Concern   Not on file  Social History Narrative   Not on file   Social Drivers of Health   Financial Resource Strain: Not on file  Food Insecurity: No Food Insecurity (04/16/2022)   Hunger Vital Sign    Worried About Running Out of Food in the Last Year: Never true    Ran Out of Food in the Last Year: Never true  Transportation Needs: No Transportation Needs (04/16/2022)   PRAPARE - Transportation    Lack of Transportation (Medical): No    Lack of Transportation (Non-Medical): No  Physical Activity: Not on file  Stress: Not on file  Social Connections: Not on file  Intimate Partner Violence: Not on file    Family History  Problem Relation Age of Onset   Hypertension Mother    Heart disease Father    Cancer Maternal Grandfather      The following  portions of the patient's history were reviewed and updated as appropriate: allergies, current medications, past family history, past medical history, past social history, past surgical history and problem list.  Review of Systems Pertinent items are noted in HPI.   Objective:  BP (!) 172/106   Pulse 92   Ht 5\' 11"  (1.803 m)   Wt 208 lb (94.3 kg)   BMI 29.01 kg/m  CONSTITUTIONAL: Well-developed, well-nourished female in no acute distress.  HENT:  Normocephalic, atraumatic, External right and left ear normal. Oropharynx is clear and moist EYES: Conjunctivae and EOM are normal. Pupils are equal, round, and reactive to light. No scleral icterus.  NECK: Normal range of motion, supple, no masses.  Normal thyroid.  SKIN: Skin is warm and dry. No rash noted. Not diaphoretic. No erythema. No pallor. NEUROLOGIC: Alert and oriented to person,  place, and time. Normal reflexes, muscle tone coordination. No cranial nerve deficit noted. PSYCHIATRIC: Normal mood and affect. Normal behavior. Normal judgment and thought content. CARDIOVASCULAR: Normal heart rate noted RESPIRATORY: Effort normal, no problems with respiration noted. BREASTS: Symmetric in size. No masses, skin changes, nipple drainage, or lymphadenopathy. ABDOMEN: Soft, no distention noted.  No tenderness, rebound or guarding.  PELVIC: deferred MUSCULOSKELETAL: Normal range of motion. No tenderness.  No cyanosis, clubbing, or edema.   Exam done with chaperone present.   Assessment and Plan:   1. Abnormal uterine bleeding (AUB) - Recommend repeat TVUS (last one 2022) and endometrial biopsy to rule out structural cause of bleeding like polyp - she is agreeable to this, will return after TVUS for endometrial biopsy - reviewed that there are other options for management of bleeding given weight gain secondary to megace , but for now, she is happy to continue megace  - megestrol  (MEGACE ) 40 MG tablet; Take 1 tablet twice a day  Dispense: 180 tablet; Refill: 0 - US  PELVIC COMPLETE WITH TRANSVAGINAL; Future  2. Fibroids - megestrol  (MEGACE ) 40 MG tablet; Take 1 tablet twice a day  Dispense: 180 tablet; Refill: 0  3. Well woman exam (Primary)  4. Encounter for screening mammogram for malignant neoplasm of breast - MM 3D SCREENING MAMMOGRAM BILATERAL BREAST; Future  5. Hot flashes Potentially starting perimenopause  6. Elevated blood pressure reading in office without diagnosis of hypertension Referral to FM   Will follow up results of US  and manage accordingly. Encouraged improvement in diet and exercise.   Routine preventative health maintenance measures emphasized. Please refer to After Visit Summary for other counseling recommendations.    Larence Pleas, MD, [facility] Attending Center for [employer] [facility])

## 2023-06-29 LAB — POCT URINALYSIS DIP (DEVICE)
Bilirubin Urine: NEGATIVE
Glucose, UA: NEGATIVE mg/dL
Ketones, ur: NEGATIVE mg/dL
Leukocytes,Ua: NEGATIVE
Nitrite: POSITIVE — AB
Protein, ur: NEGATIVE mg/dL
Specific Gravity, Urine: >=1.03 (ref 1.005–1.030)
Urobilinogen, UA: 2 mg/dL — ABNORMAL HIGH (ref 0.0–1.0)
pH: 6 (ref 5.0–8.0)

## 2023-07-02 LAB — URINE CULTURE

## 2023-07-03 ENCOUNTER — Encounter: Payer: Self-pay | Admitting: Obstetrics and Gynecology

## 2023-07-03 MED ORDER — SULFAMETHOXAZOLE-TRIMETHOPRIM 800-160 MG PO TABS
1.0000 | ORAL_TABLET | Freq: Two times a day (BID) | ORAL | 0 refills | Status: AC
Start: 2023-07-03 — End: ?

## 2023-07-03 NOTE — Addendum Note (Signed)
 Addended by: Dellie Fergusson on: 07/03/2023 09:06 AM   Modules accepted: Orders

## 2023-08-03 ENCOUNTER — Other Ambulatory Visit (HOSPITAL_COMMUNITY)
Admission: RE | Admit: 2023-08-03 | Discharge: 2023-08-03 | Disposition: A | Discharge status: Home / Self Care | Source: Ambulatory Visit | Attending: Obstetrics and Gynecology | Admitting: Obstetrics and Gynecology

## 2023-08-03 ENCOUNTER — Encounter: Payer: Self-pay | Admitting: Obstetrics and Gynecology

## 2023-08-03 ENCOUNTER — Ambulatory Visit (INDEPENDENT_AMBULATORY_CARE_PROVIDER_SITE_OTHER): Payer: Self-pay | Admitting: Obstetrics and Gynecology

## 2023-08-03 ENCOUNTER — Other Ambulatory Visit: Payer: Self-pay

## 2023-08-03 VITALS — BP 158/86 | HR 85 | Wt 206.0 lb

## 2023-08-03 DIAGNOSIS — N939 Abnormal uterine and vaginal bleeding, unspecified: Secondary | ICD-10-CM

## 2023-08-03 DIAGNOSIS — Z1331 Encounter for screening for depression: Secondary | ICD-10-CM

## 2023-08-03 DIAGNOSIS — R399 Unspecified symptoms and signs involving the genitourinary system: Secondary | ICD-10-CM

## 2023-08-03 NOTE — Progress Notes (Signed)
 GYNECOLOGY VISIT  Patient name: Kendra [name] MRN [id number]  Date of birth: 1970-05-01 Chief Complaint:   Gynecologic Exam  History:  Kendra [name] is a 53 y.o. G2P1011 being seen today for AUB. Previously noted that she has unable to wean off megace  and that every time she stops she has bleeding. Here for EMB today.   Reports having UTI symptoms as well.   Past Medical History:  Diagnosis Date   Anemia    Fibroid, uterine     Past Surgical History:  Procedure Laterality Date   DILATION AND CURETTAGE OF UTERUS  2004    The following portions of the patient's history were reviewed and updated as appropriate: allergies, current medications, past family history, past medical history, past social history, past surgical history and problem list.   Health Maintenance:   Last pap     Component Value Date/Time   DIAGPAP  01/22/2021 1215    - Negative for Intraepithelial Lesions or Malignancy (NILM)   DIAGPAP - Benign reactive/reparative changes 01/22/2021 1215   HPVHIGH Negative 01/22/2021 1215   ADEQPAP  01/22/2021 1215    Satisfactory for evaluation; transformation zone component PRESENT.    Last mammogram: not seen on file   Review of Systems:  Pertinent items are noted in HPI. Comprehensive review of systems was otherwise negative.   Objective:  Physical Exam BP (!) 158/86   Pulse 85   Wt 206 lb (93.4 kg)   LMP  (LMP Unknown)   BMI 28.73 kg/m    Physical Exam Vitals and nursing note reviewed. Exam conducted with a chaperone present.  Constitutional:      Appearance: Normal appearance.  HENT:     Head: Normocephalic and atraumatic.  Pulmonary:     Effort: Pulmonary effort is normal.     Breath sounds: Normal breath sounds.  Genitourinary:    General: Normal vulva.     Exam position: Lithotomy position.     Vagina: Normal.     Cervix: Normal.     Comments: Small, atrophic cervix Skin:    General: Skin is warm and dry.  Neurological:     General: No  focal deficit present.     Mental Status: She is alert.  Psychiatric:        Mood and Affect: Mood normal.        Behavior: Behavior normal.        Thought Content: Thought content normal.        Judgment: Judgment normal.      Endometrial Biopsy Procedure  Patient identified, informed consent performed,  indication reviewed, consent signed.  Reviewed risk of perforation, pain, bleeding, insufficient sample, etc were reviewd. Time out was performed.  Urine pregnancy test negative.  Speculum placed in the vagina.  Cervix visualized.  Cleaned with Betadine x 2.  Anterior cervix grasped anteriorly with a single tooth tenaculum.  Paracervical block was not administered.  Endometrial pipelle was used to draw up 1cc of 1% lidocaine, introduced into the cervical os and instilled into the endometrial cavity.  The pipelle was passed once without difficulty and sample obtained. Tenaculum was removed, good hemostasis noted.  Patient tolerated procedure well.  Patient was given post-procedure instructions.      Assessment & Plan:   1. UTI symptoms (Primary) UA/UC completed today.  - Urine Culture  2. Abnormal uterine bleeding (AUB) Now s/p uncomplicated EMB, will follow up results. Continue megace  for now.  - Surgical pathology   Routine preventative  health maintenance measures emphasized.  Kiki Pelton, MD Minimally Invasive Gynecologic Surgery Center for [facility], [facility]

## 2023-08-04 LAB — POCT URINALYSIS DIP (DEVICE)
Glucose, UA: NEGATIVE mg/dL
Ketones, ur: NEGATIVE mg/dL
Leukocytes,Ua: NEGATIVE
Nitrite: NEGATIVE
Protein, ur: 30 mg/dL — AB
Specific Gravity, Urine: >=1.03 (ref 1.005–1.030)
Urobilinogen, UA: 0.2 mg/dL (ref 0.0–1.0)
pH: 6 (ref 5.0–8.0)

## 2023-08-05 ENCOUNTER — Ambulatory Visit: Payer: Self-pay | Admitting: Obstetrics and Gynecology

## 2023-08-08 ENCOUNTER — Telehealth: Payer: Self-pay | Admitting: Obstetrics and Gynecology

## 2023-08-08 ENCOUNTER — Telehealth: Payer: Self-pay

## 2023-08-08 NOTE — Telephone Encounter (Signed)
 Patient was not able to get into her MyChart. I sent her the link to reset her password. However, she is requesting a call from clinical staff.

## 2023-08-08 NOTE — Telephone Encounter (Signed)
Called Pt to go over test results, no answer, left VM for call back. 

## 2023-08-17 IMAGING — US US PELVIS COMPLETE WITH TRANSVAGINAL
1 series · 13 of 25 positions shown · non-contrast
Comparison: Prior ultrasound from 07/02/2014.

CLINICAL DATA: Initial evaluation for uterine fibroids.

EXAM:
TRANSABDOMINAL AND TRANSVAGINAL ULTRASOUND OF PELVIS
TECHNIQUE: Both transabdominal and transvaginal ultrasound examinations of the
pelvis were performed. Transabdominal technique was performed for
global imaging of the pelvis including uterus, ovaries, adnexal
regions, and pelvic cul-de-sac. It was necessary to proceed with
endovaginal exam following the transabdominal exam to visualize the
the uterus, endometrium, and ovaries.

[Series 1: us pelvic complete with transvaginal · 13 of 139 slices shown]
[im 1/139]
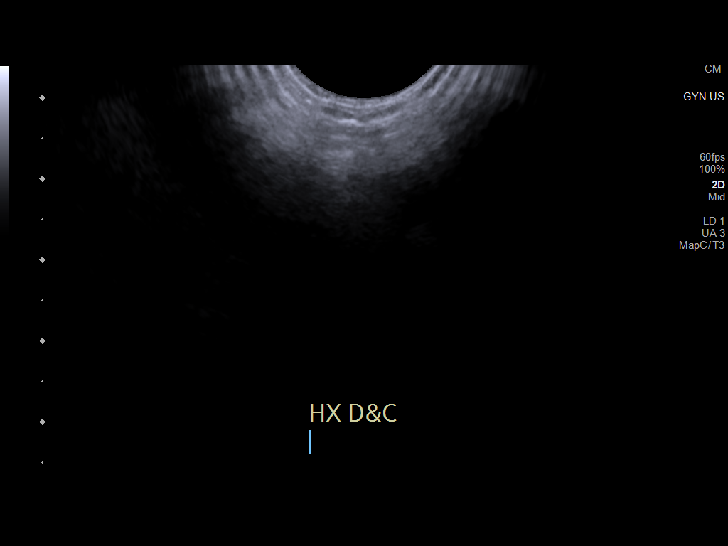
[im 12/139]
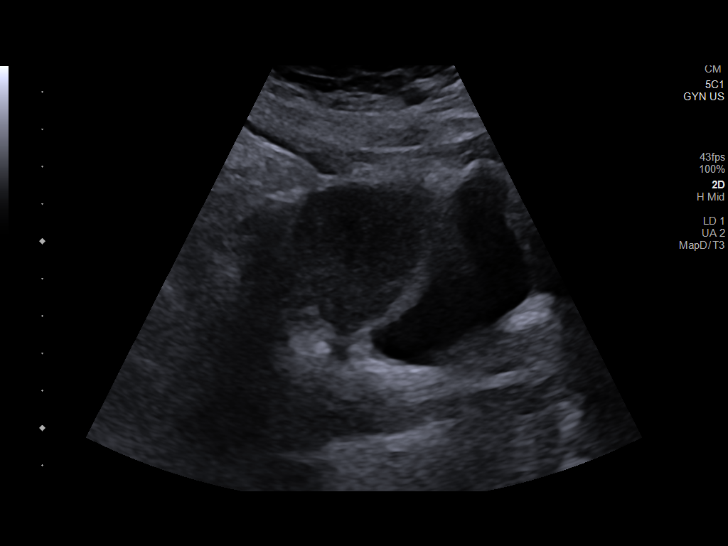
[im 24/139]
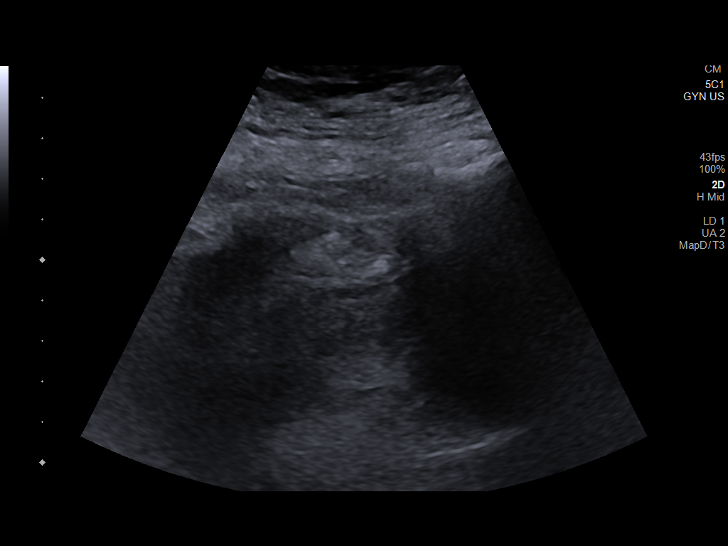
[im 35/139]
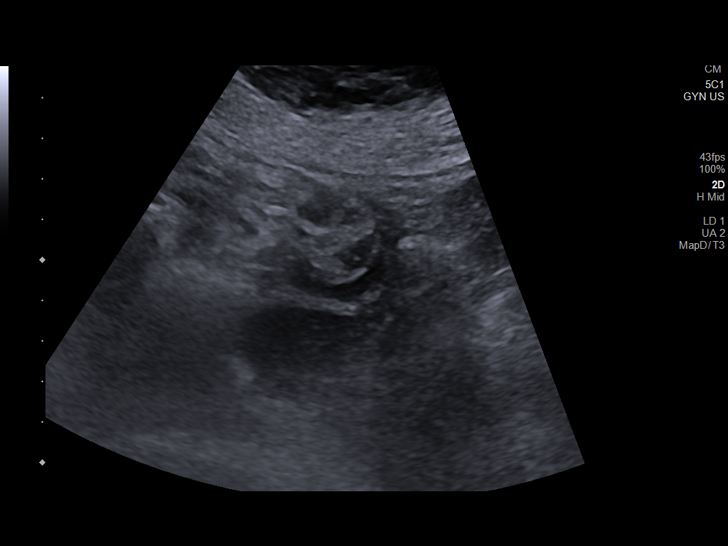
[im 47/139]
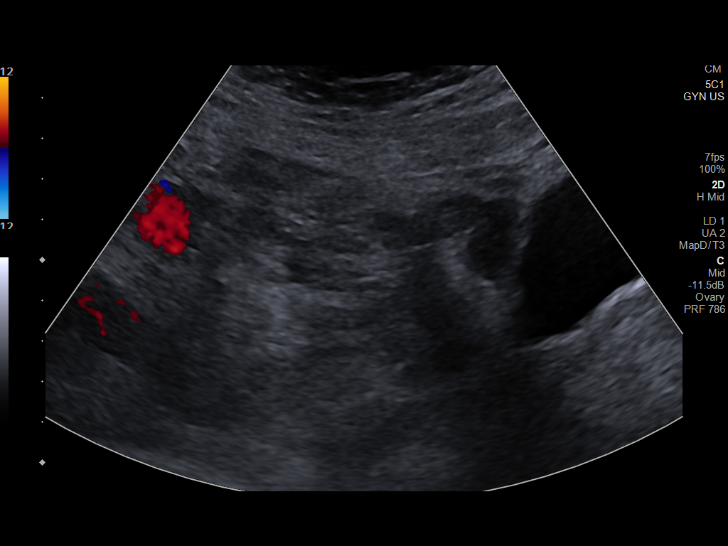
[im 58/139]
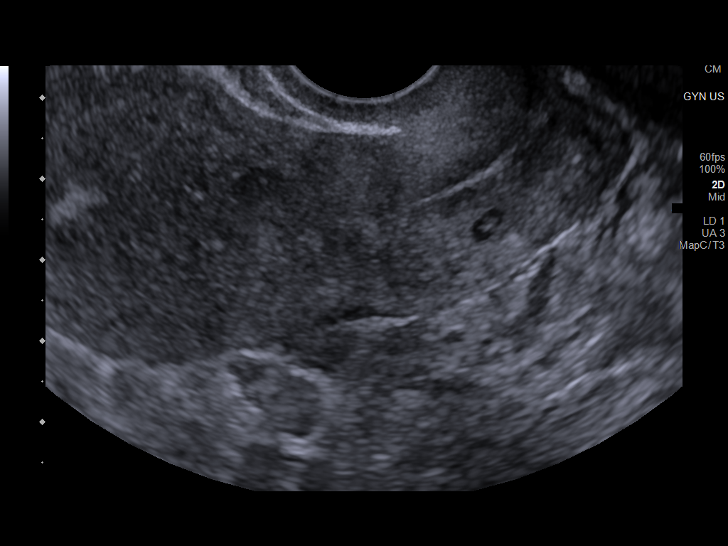
[im 70/139]
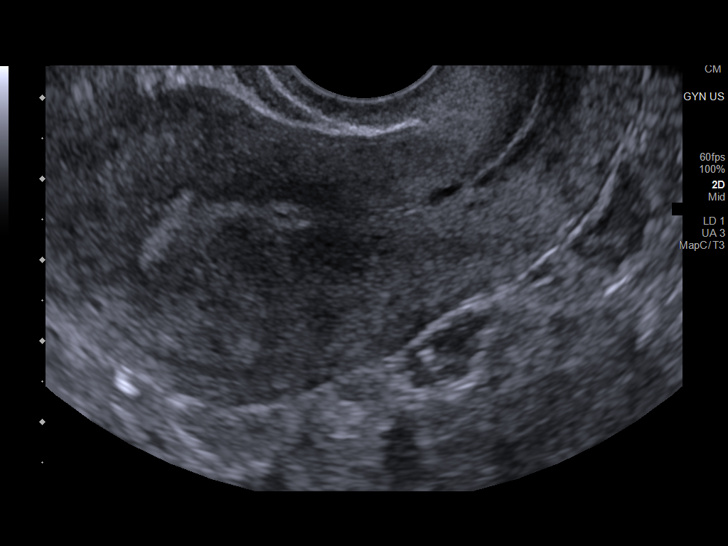
[im 81/139]
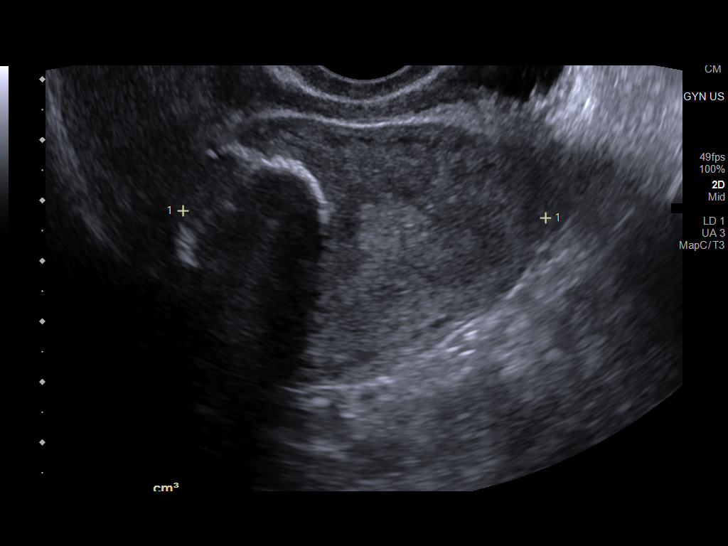
[im 93/139]
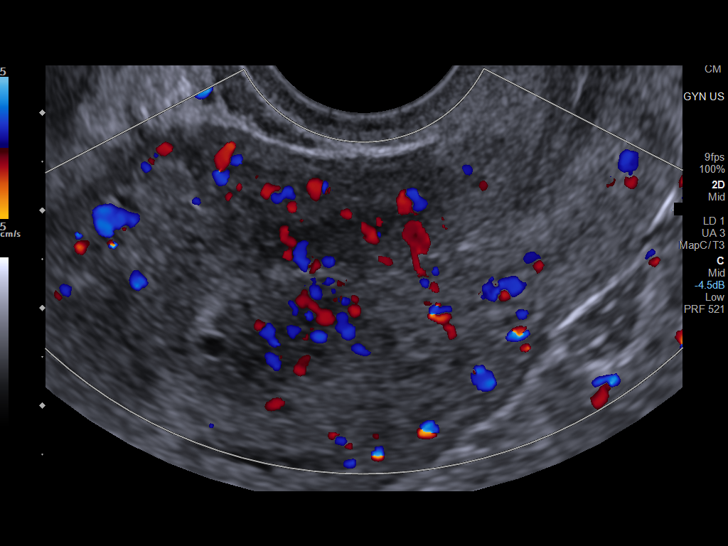
[im 104/139]
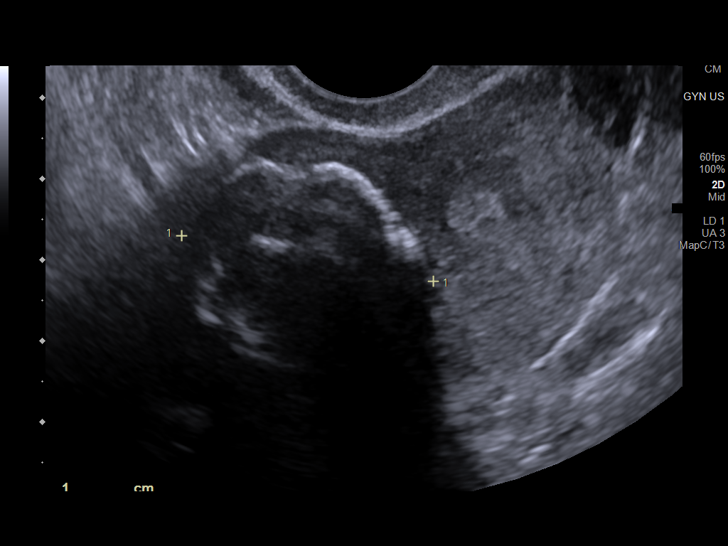
[im 116/139]
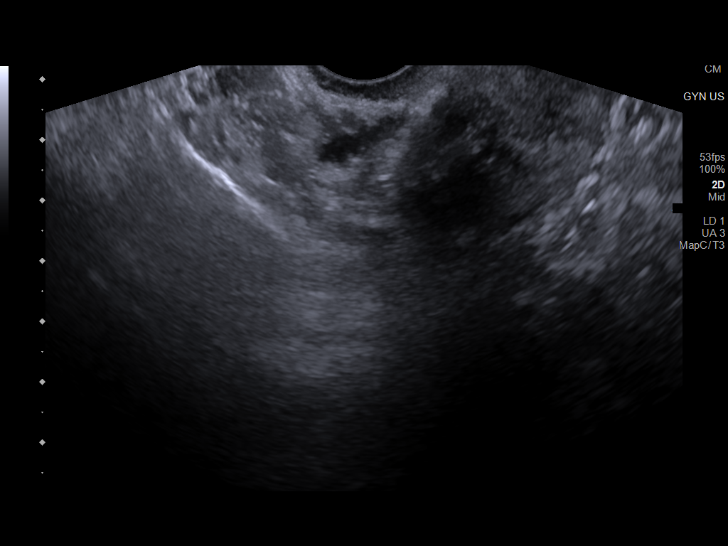
[im 127/139]
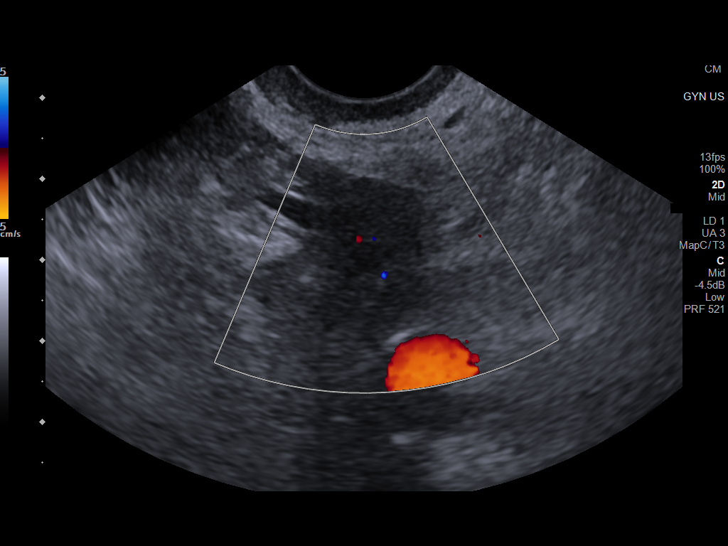
[im 139/139]
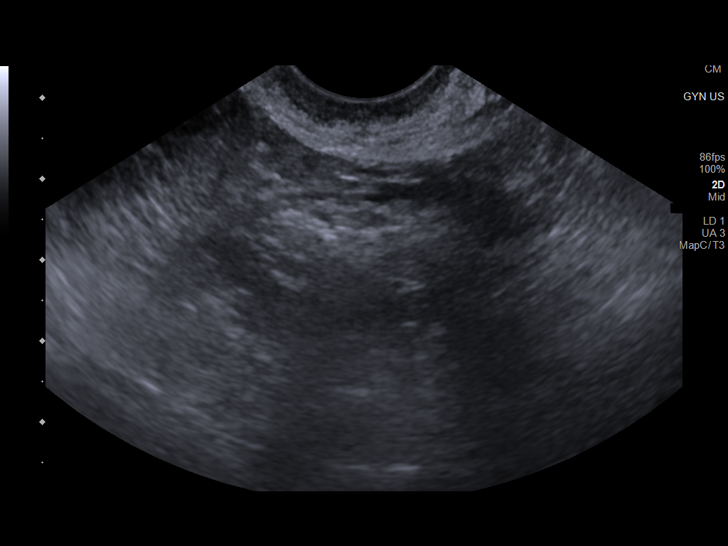

[13 of 25 positions shown; findings below may reference images not displayed]

FINDINGS: Uterus

Measurements: 7.7 x 4.1 x 6.3 cm = volume: 103.8 mL. Uterus is
anteverted. Multiple fibroids are again seen, 2 largest of which are
measured. 2.9 x 2.7 x 3.2 cm peripherally calcified intramural
fibroid seen at the right uterine fundus, similar, previously 2.7 x
2.8 x 2.8 cm. An additional 2.5 x 1.3 x 2.1 cm intramural fibroid
present at the right uterine body.

Endometrium

Thickness: 4.1 mm.  No focal abnormality visualized.

Right ovary

Measurements: 3.1 x 2.3 x 1.6 cm = volume: 5.8 mL. Normal
appearance/no adnexal mass.

Left ovary

Measurements: 2.4 x 1.5 x 1.3 cm = volume: 2.5 mL. Normal
appearance/no adnexal mass.

Other findings

No abnormal free fluid.
IMPRESSION: 1. Fibroid uterus as detailed above, relatively similar as compared
to 07/02/2014.
2. Endometrial stripe within normal limits measuring 4.1 mm.
3. Normal sonographic appearance of the ovaries. No adnexal mass or
free fluid.

## 2023-09-05 ENCOUNTER — Other Ambulatory Visit: Payer: Self-pay | Admitting: Family Medicine

## 2023-09-05 DIAGNOSIS — N939 Abnormal uterine and vaginal bleeding, unspecified: Secondary | ICD-10-CM

## 2023-09-05 DIAGNOSIS — D219 Benign neoplasm of connective and other soft tissue, unspecified: Secondary | ICD-10-CM

## 2023-09-08 ENCOUNTER — Telehealth: Payer: Self-pay | Admitting: *Deleted

## 2023-09-08 DIAGNOSIS — Z760 Encounter for issue of repeat prescription: Secondary | ICD-10-CM

## 2023-09-08 MED ORDER — MEGESTROL ACETATE 20 MG PO TABS: 40.0000 mg | ORAL_TABLET | Freq: Two times a day (BID) | ORAL | 0 refills | Status: DC

## 2023-09-08 NOTE — Telephone Encounter (Addendum)
 Patient called the office and left a message requesting a call back regarding a medication. Returned call to patient and used two patient identifiers; she stated that her pharmacy is out of the 40 mg tablets of Megace  and will not be restocked until at least mid August.  A new prescription was sent to her pharmacy for a 1 month supply of 20 mg tablets of Megace .  Discussed with patient that she will need to take two of the 20 mg tablets twice a day until the 40 mg tablets can be filled.  Once she has completed the 1 month prescription, go back to the 40 mg tablet prescription and resume taking it as ordered. Verified correct pharmacy is on file; patient expressed understanding.

## 2023-09-09 ENCOUNTER — Other Ambulatory Visit: Payer: Self-pay

## 2023-09-09 DIAGNOSIS — N939 Abnormal uterine and vaginal bleeding, unspecified: Secondary | ICD-10-CM

## 2023-09-09 DIAGNOSIS — D219 Benign neoplasm of connective and other soft tissue, unspecified: Secondary | ICD-10-CM

## 2023-09-09 DIAGNOSIS — Z760 Encounter for issue of repeat prescription: Secondary | ICD-10-CM

## 2023-09-09 MED ORDER — MEGESTROL ACETATE 20 MG PO TABS: 40.0000 mg | ORAL_TABLET | Freq: Two times a day (BID) | ORAL | 0 refills | Status: AC

## 2023-09-09 MED ORDER — MEGESTROL ACETATE 40 MG PO TABS: ORAL_TABLET | ORAL | 0 refills | Status: AC

## 2023-09-09 NOTE — Telephone Encounter (Signed)
 Received fax from [facility] for a refill request of Megestrol  Acetate 40mg  1 tablet BID.   [place], RN
